# Patient Record
Sex: Female | Born: 1995 | Race: White | Hispanic: No | Marital: Single | Smoking: Never smoker
Health system: Southern US, Community
[De-identification: ages and names within clinical notes are randomized; demographics above are authoritative.]

## PROBLEM LIST (undated history)

## (undated) DIAGNOSIS — J3081 Allergic rhinitis due to animal (cat) (dog) hair and dander: Secondary | ICD-10-CM

## (undated) DIAGNOSIS — F329 Major depressive disorder, single episode, unspecified: Secondary | ICD-10-CM

## (undated) DIAGNOSIS — F913 Oppositional defiant disorder: Secondary | ICD-10-CM

## (undated) DIAGNOSIS — F32A Depression, unspecified: Secondary | ICD-10-CM

## (undated) HISTORY — DX: Major depressive disorder, single episode, unspecified: F32.9

## (undated) HISTORY — DX: Allergic rhinitis due to animal (cat) (dog) hair and dander: J30.81

## (undated) HISTORY — PX: CHOLECYSTECTOMY: SHX55

## (undated) HISTORY — DX: Oppositional defiant disorder: F91.3

## (undated) HISTORY — DX: Depression, unspecified: F32.A

## (undated) HISTORY — PX: OTHER SURGICAL HISTORY: SHX169

---
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
## 2017-01-26 DIAGNOSIS — N882 Stricture and stenosis of cervix uteri: Secondary | ICD-10-CM | POA: Insufficient documentation

## 2019-03-04 ENCOUNTER — Telehealth: Payer: Self-pay | Admitting: Obstetrics & Gynecology

## 2019-03-04 NOTE — Telephone Encounter (Signed)
Called patient regarding appointment and the following message was left: ° ° °We have you scheduled for an upcoming appointment at our office. At this time, patients are encouraged to come alone to their visits whenever possible, however, a support person, over age 21, may accompany you to your appointment if assistance is needed for safety or care concerns. Otherwise, support persons should remain outside until the visit is complete.  ° °We ask if you have had any exposure to anyone suspected or confirmed of having COVID-19 or if you are experiencing any of the following, to call and reschedule your appointment: fever, cough, shortness of breath, muscle pain, diarrhea, rash, vomiting, abdominal pain, red eye, weakness, bruising, bleeding, joint pain, or a severe headache.  ° °Please know we will ask you these questions or similar questions when you arrive for your appointment and again it’s how we are keeping everyone safe.   ° °Also,to keep you safe, please use the provided hand sanitizer when you enter the office. We are asking everyone in the office to wear a mask to help prevent the spread of °germs. If you have a mask of your own, please wear it to your appointment, if not, we are happy to provide one for you. ° °Thank you for understanding and your cooperation.  ° ° °[facility] Staff ° ° ° ° ° °

## 2019-03-05 ENCOUNTER — Other Ambulatory Visit: Payer: Self-pay

## 2019-03-05 ENCOUNTER — Encounter: Payer: Self-pay | Admitting: *Deleted

## 2019-03-05 ENCOUNTER — Ambulatory Visit (INDEPENDENT_AMBULATORY_CARE_PROVIDER_SITE_OTHER): Payer: Medicaid Other | Admitting: *Deleted

## 2019-03-05 VITALS — BP 129/87 | HR 110 | Ht 61.0 in | Wt 122.5 lb

## 2019-03-05 DIAGNOSIS — Z3201 Encounter for pregnancy test, result positive: Secondary | ICD-10-CM

## 2019-03-05 LAB — POCT URINE PREGNANCY: Preg Test, Ur: POSITIVE — AB

## 2019-03-05 NOTE — Progress Notes (Signed)
   NURSE VISIT- PREGNANCY CONFIRMATION   SUBJECTIVE:  Gabriella Wilson is a 23 y.o. G1P0 female at [redacted]w[redacted]d by certain LMP of Patient's last menstrual period was 01/26/2019 (approximate). Here for pregnancy confirmation.  Home pregnancy test: 20+ pregnancy tests at home.  She reports nausea.  She is not taking prenatal vitamins.    OBJECTIVE:  BP 129/87 (BP Location: Left Arm, Patient Position: Sitting, Cuff Size: Normal)   Pulse (!) 110   Ht 5\' 1"  (1.549 m)   Wt 122 lb 8 oz (55.6 kg)   LMP 01/26/2019 (Approximate)   BMI 23.15 kg/m   Appears well, in no apparent distress OB History  Gravida Para Term Preterm AB Living  1            SAB TAB Ectopic Multiple Live Births               # Outcome Date GA Lbr Len/2nd Weight Sex Delivery Anes PTL Lv  1 Current             Results for orders placed or performed in visit on 03/05/19 (from the past 24 hour(s))  POCT urine pregnancy   Collection Time: 03/05/19  3:11 PM  Result Value Ref Range   Preg Test, Ur Positive (A) Negative    ASSESSMENT: Positive pregnancy test, [redacted]w[redacted]d by LMP    PLAN: Schedule for dating ultrasound in 2 weeks Prenatal vitamins: note routed to [place] booker to send prescription   Nausea medicines: requested-note routed to [place] booker to send prescription   OB packet given: Yes  Levy Pupa  03/05/2019 3:12 PM

## 2019-03-15 ENCOUNTER — Telehealth: Payer: Self-pay | Admitting: Obstetrics & Gynecology

## 2019-03-15 ENCOUNTER — Other Ambulatory Visit: Payer: Self-pay | Admitting: Obstetrics & Gynecology

## 2019-03-15 DIAGNOSIS — O3680X0 Pregnancy with inconclusive fetal viability, not applicable or unspecified: Secondary | ICD-10-CM

## 2019-03-15 NOTE — Telephone Encounter (Signed)
Tried to call the patient to remind her of her appointment/restrictions, no answer.

## 2019-03-18 ENCOUNTER — Ambulatory Visit (INDEPENDENT_AMBULATORY_CARE_PROVIDER_SITE_OTHER): Payer: Medicaid Other

## 2019-03-18 ENCOUNTER — Other Ambulatory Visit: Payer: Self-pay

## 2019-03-18 DIAGNOSIS — Z3A01 Less than 8 weeks gestation of pregnancy: Secondary | ICD-10-CM

## 2019-03-18 DIAGNOSIS — O3680X0 Pregnancy with inconclusive fetal viability, not applicable or unspecified: Secondary | ICD-10-CM | POA: Diagnosis not present

## 2019-03-18 NOTE — Progress Notes (Signed)
[place] 6+1 wks,single IUP w/ys,positive fht 111 bpm,crl 5.33 mm,normal ovaries bilat

## 2019-03-19 ENCOUNTER — Other Ambulatory Visit: Payer: Medicaid Other

## 2019-04-09 ENCOUNTER — Other Ambulatory Visit (INDEPENDENT_AMBULATORY_CARE_PROVIDER_SITE_OTHER): Payer: Medicaid Other | Admitting: *Deleted

## 2019-04-09 ENCOUNTER — Other Ambulatory Visit: Payer: Self-pay

## 2019-04-09 ENCOUNTER — Other Ambulatory Visit: Payer: Self-pay | Admitting: Women's Health

## 2019-04-09 VITALS — BP 110/76 | HR 72

## 2019-04-09 DIAGNOSIS — R300 Dysuria: Secondary | ICD-10-CM

## 2019-04-09 LAB — POCT URINALYSIS DIPSTICK OB
Glucose, UA: NEGATIVE
Leukocytes, UA: NEGATIVE
Nitrite, UA: NEGATIVE

## 2019-04-09 MED ORDER — CEPHALEXIN 500 MG PO CAPS: 500.0000 mg | ORAL_CAPSULE | Freq: Four times a day (QID) | ORAL | 0 refills | Status: DC

## 2019-04-09 NOTE — Progress Notes (Addendum)
   NURSE VISIT- UTI SYMPTOMS   SUBJECTIVE:  Gabriella Wilson is a 23 y.o. G1P0 female here for UTI symptoms. She is [redacted]w[redacted]d pregnant. She reports dysuria and frequency.  OBJECTIVE:  BP 110/76 (BP Location: Right Arm, Patient Position: Sitting, Cuff Size: Normal)   Pulse 72   LMP 01/26/2019 (Approximate)   Appears well, in no apparent distress  Results for orders placed or performed in visit on 04/09/19 (from the past 24 hour(s))  POC Urinalysis Dipstick OB   Collection Time: 04/09/19 10:02 AM  Result Value Ref Range   Color, UA     Clarity, UA     Glucose, UA Negative Negative   Bilirubin, UA     Ketones, UA large    Spec Grav, UA     Blood, UA large    pH, UA     POC,PROTEIN,UA Moderate (2+) Negative, Trace, Small (1+), Moderate (2+), Large (3+), 4+   Urobilinogen, UA     Nitrite, UA neg    Leukocytes, UA Negative Negative   Appearance     Odor      ASSESSMENT: Pregnancy [redacted]w[redacted]d with UTI symptoms and negative nitrites  PLAN: Note routed to Knute Neu, CNM, [facility]   Rx sent by provider today: Yes Urine culture sent Call or return to clinic prn if these symptoms worsen or fail to improve as anticipated. Follow-up: as scheduled   Rash, Celene Squibb  04/09/2019 10:03 AM   Chart reviewed for nurse visit. Agree with plan of care. Rx keflex Roma Schanz, [place] 04/09/2019 1:00 PM

## 2019-04-10 ENCOUNTER — Telehealth: Payer: Self-pay | Admitting: *Deleted

## 2019-04-10 NOTE — Telephone Encounter (Addendum)
Pt is pregnant and is having some anxiety and depression. Has been on Abilify in the past, and that works great. Has tried Zoloft in the past but that med mad her angry. Pt don't feel suicidal at this time, just feels blah. Pt is due 11/10/19. Spoke with Dr. Rip Harbour. He can order Vistaril and pt wants to be sent to a counselor. I called in Vistaril 25 mg 1 tab po every 6 hours prn # 30 with 1 refill to [facility] in [place]. [place]

## 2019-04-10 NOTE — Telephone Encounter (Signed)
The # pt left is not in service; I called the [phone] # and left a message for pt to return call. Keddie

## 2019-04-10 NOTE — Telephone Encounter (Signed)
Pt left message that she needs to speak with someone about anxiety and depression. She is really struggling currently.

## 2019-04-11 NOTE — Telephone Encounter (Signed)
Will email information today to [facility].

## 2019-04-13 LAB — URINE CULTURE

## 2019-04-13 LAB — SPECIMEN STATUS REPORT

## 2019-04-15 ENCOUNTER — Encounter: Payer: Self-pay | Admitting: Women's Health

## 2019-04-15 DIAGNOSIS — O2341 Unspecified infection of urinary tract in pregnancy, first trimester: Secondary | ICD-10-CM | POA: Insufficient documentation

## 2019-04-26 ENCOUNTER — Other Ambulatory Visit: Payer: Self-pay | Admitting: Obstetrics & Gynecology

## 2019-04-26 ENCOUNTER — Telehealth: Payer: Self-pay | Admitting: *Deleted

## 2019-04-26 ENCOUNTER — Telehealth: Payer: Self-pay | Admitting: Obstetrics & Gynecology

## 2019-04-26 DIAGNOSIS — Z3682 Encounter for antenatal screening for nuchal translucency: Secondary | ICD-10-CM

## 2019-04-26 MED ORDER — CEPHALEXIN 500 MG PO CAPS: 500.0000 mg | ORAL_CAPSULE | Freq: Four times a day (QID) | ORAL | 0 refills | Status: DC

## 2019-04-26 NOTE — Telephone Encounter (Signed)
LMVOM returning patient's call.  

## 2019-04-26 NOTE — Telephone Encounter (Signed)
Patient left message that she has recently moved and lost her antibiotics.  She only took a few pills and is still having the same symptoms. Requesting a refill on the antibiotics.

## 2019-04-26 NOTE — Telephone Encounter (Signed)
Patient states she only took 4 days worth of the antibiotics prescribed for an UTI as she was in the process of moving and lost the medication.  She was feeling better so she did not worry about it but now her symptoms have returned.  C/o burning with urination, urgency, frequency.  She is requesting the antibiotics be resent for treatment.

## 2019-05-01 ENCOUNTER — Telehealth: Payer: Self-pay | Admitting: Advanced Practice Midwife

## 2019-05-01 NOTE — Telephone Encounter (Signed)

## 2019-05-06 ENCOUNTER — Other Ambulatory Visit: Payer: Self-pay

## 2019-05-06 ENCOUNTER — Encounter: Payer: Self-pay | Admitting: Advanced Practice Midwife

## 2019-05-06 ENCOUNTER — Ambulatory Visit (INDEPENDENT_AMBULATORY_CARE_PROVIDER_SITE_OTHER): Payer: Medicaid Other | Admitting: Advanced Practice Midwife

## 2019-05-06 ENCOUNTER — Ambulatory Visit (INDEPENDENT_AMBULATORY_CARE_PROVIDER_SITE_OTHER): Payer: Medicaid Other

## 2019-05-06 ENCOUNTER — Ambulatory Visit: Payer: Medicaid Other | Admitting: *Deleted

## 2019-05-06 VITALS — BP 115/75 | HR 91 | Wt 116.0 lb

## 2019-05-06 DIAGNOSIS — Z3A13 13 weeks gestation of pregnancy: Secondary | ICD-10-CM

## 2019-05-06 DIAGNOSIS — Z1389 Encounter for screening for other disorder: Secondary | ICD-10-CM

## 2019-05-06 DIAGNOSIS — Z3401 Encounter for supervision of normal first pregnancy, first trimester: Secondary | ICD-10-CM

## 2019-05-06 DIAGNOSIS — Z3682 Encounter for antenatal screening for nuchal translucency: Secondary | ICD-10-CM

## 2019-05-06 DIAGNOSIS — O0990 Supervision of high risk pregnancy, unspecified, unspecified trimester: Secondary | ICD-10-CM | POA: Insufficient documentation

## 2019-05-06 DIAGNOSIS — Z113 Encounter for screening for infections with a predominantly sexual mode of transmission: Secondary | ICD-10-CM

## 2019-05-06 DIAGNOSIS — Z3402 Encounter for supervision of normal first pregnancy, second trimester: Secondary | ICD-10-CM

## 2019-05-06 DIAGNOSIS — Z1379 Encounter for other screening for genetic and chromosomal anomalies: Secondary | ICD-10-CM

## 2019-05-06 DIAGNOSIS — Z130 Encounter for screening for diseases of the blood and blood-forming organs and certain disorders involving the immune mechanism: Secondary | ICD-10-CM

## 2019-05-06 DIAGNOSIS — Z331 Pregnant state, incidental: Secondary | ICD-10-CM

## 2019-05-06 DIAGNOSIS — Z0283 Encounter for blood-alcohol and blood-drug test: Secondary | ICD-10-CM

## 2019-05-06 DIAGNOSIS — Z3400 Encounter for supervision of normal first pregnancy, unspecified trimester: Secondary | ICD-10-CM | POA: Insufficient documentation

## 2019-05-06 LAB — POCT URINALYSIS DIPSTICK OB
Blood, UA: NEGATIVE
Glucose, UA: NEGATIVE
Leukocytes, UA: NEGATIVE
Nitrite, UA: NEGATIVE
POC,PROTEIN,UA: NEGATIVE

## 2019-05-06 NOTE — Patient Instructions (Signed)

## 2019-05-06 NOTE — Progress Notes (Signed)
     INITIAL OBSTETRICAL VISIT Patient name: Gabriella Wilson MRN [id number]  Date of birth: 01-13-1996 Chief Complaint:   Initial Prenatal Visit (nt/it)  History of Present Illness:   Gabriella Wilson is a 23 y.o. G68P0 Caucasian female at [redacted]w[redacted]d by LMP/early [place] with an Estimated Date of Delivery: 11/10/19 being seen today for her initial obstetrical visit.   Her obstetrical history is significant for first baby.   Today she reports no complaints.  Patient's last menstrual period was 01/26/2019 (approximate). Last pap n/a Review of Systems:   Pertinent items are noted in HPI Denies cramping/contractions, leakage of fluid, vaginal bleeding, abnormal vaginal discharge w/ itching/odor/irritation, headaches, visual changes, shortness of breath, chest pain, abdominal pain, severe nausea/vomiting, or problems with urination or bowel movements unless otherwise stated above.  Pertinent History Reviewed:  Reviewed past medical,surgical, social, obstetrical and family history.  Reviewed problem list, medications and allergies. OB History  Gravida Para Term Preterm AB Living  1            SAB TAB Ectopic Multiple Live Births               # Outcome Date GA Lbr Len/2nd Weight Sex Delivery Anes PTL Lv  1 Current            Physical Assessment:   Vitals:   05/06/19 1446  BP: 115/75  Pulse: 91  Weight: 116 lb (52.6 kg)  Body mass index is 21.92 kg/m.       Physical Examination:  General appearance - well appearing, and in no distress  Mental status - alert, oriented to person, place, and time  Psych:  She has a normal mood and affect  Skin - warm and dry, normal color, no suspicious lesions noted  Chest - effort normal, all lung fields clear to auscultation bilaterally  Heart - normal rate and regular rhythm  Abdomen - soft, nontender  Extremities:  No swelling or varicosities noted     via [place] 13+1 wks,measurLowements c/w dates,crl 76.53 mm,NT 1.5 mm,NB present,fhr 141 bpm,fundal placenta,normal  ovaries   No results found for this or any previous visit (from the past 24 hour(s)).  Assessment & Plan:  1) Low-Risk Pregnancy G1P0 at [redacted]w[redacted]d with an Estimated Date of Delivery: 11/10/19   2) Initial OB visit   Meds: No orders of the defined types were placed in this encounter.   Initial labs obtained Continue prenatal vitamins Reviewed n/v relief measures and warning s/s to report Reviewed recommended weight gain based on pre-gravid BMI Encouraged well-balanced diet Watched video for carrier screening/genetic testing:  Genetic Screening discussed First Screen and Integrated Screen: requested Cystic fibrosis screening requested SMA screening requested Fragile X screening requested Ultrasound discussed; fetal survey: requested CCNC completed  Follow-up: Return in about 5 weeks (around 06/10/2019) for VE:HMCNOBS, LROB.   Orders Placed This Encounter  Procedures  . Urine Culture  . GC/Chlamydia Probe Amp  . Urinalysis, Routine w reflex microscopic  . Pain Management Screening Profile (10S)  . Sickle Cell Scr  . Integrated 1  . MaterniT21 PLUS Core  . Inheritest Core(CF97,SMA,FraX)  . Obstetric Panel, Including HIV  . Specimen status report  . Specimen status report  . Pain Management Screening Profile (10S)  . Urinalysis, Routine w reflex microscopic  . Specimen status report  . Specimen status report  . Specimen status report  . POC Urinalysis Dipstick OB    Christin Fudge DNP, CNM 05/09/2019 9:02 AM

## 2019-05-06 NOTE — Progress Notes (Signed)
[place] 13+1 wks,measurements c/w dates,crl 76.53 mm,NT 1.5 mm,NB present,fhr 141 bpm,fundal placenta,normal ovaries

## 2019-05-07 LAB — SPECIMEN STATUS REPORT

## 2019-05-07 LAB — SICKLE CELL SCREEN: Sickle Cell Screen: NEGATIVE

## 2019-05-07 LAB — PMP SCREEN PROFILE (10S), URINE

## 2019-05-07 LAB — URINALYSIS, ROUTINE W REFLEX MICROSCOPIC

## 2019-05-09 LAB — PMP SCREEN PROFILE (10S), URINE
Amphetamine Scrn, Ur: NEGATIVE ng/mL
BARBITURATE SCREEN URINE: NEGATIVE ng/mL
BENZODIAZEPINE SCREEN, URINE: NEGATIVE ng/mL
CANNABINOIDS UR QL SCN: NEGATIVE ng/mL
Cocaine (Metab) Scrn, Ur: NEGATIVE ng/mL
Creatinine(Crt), U: 242.4 mg/dL (ref 20.0–300.0)
Methadone Screen, Urine: NEGATIVE ng/mL
OXYCODONE+OXYMORPHONE UR QL SCN: NEGATIVE ng/mL
Opiate Scrn, Ur: NEGATIVE ng/mL
Ph of Urine: 5.7 (ref 4.5–8.9)
Phencyclidine Qn, Ur: NEGATIVE ng/mL
Propoxyphene Scrn, Ur: NEGATIVE ng/mL

## 2019-05-09 LAB — URINE CULTURE

## 2019-05-09 LAB — URINALYSIS, ROUTINE W REFLEX MICROSCOPIC
Bilirubin, UA: NEGATIVE
Glucose, UA: NEGATIVE
Leukocytes,UA: NEGATIVE
Nitrite, UA: NEGATIVE
Protein,UA: NEGATIVE
RBC, UA: NEGATIVE
Specific Gravity, UA: >=1.03 — AB (ref 1.005–1.030)
Urobilinogen, Ur: 1 mg/dL (ref 0.2–1.0)
pH, UA: 5 (ref 5.0–7.5)

## 2019-05-09 LAB — SPECIMEN STATUS REPORT

## 2019-05-10 ENCOUNTER — Other Ambulatory Visit: Payer: Self-pay | Admitting: Advanced Practice Midwife

## 2019-05-10 LAB — GC/CHLAMYDIA PROBE AMP

## 2019-05-10 LAB — SPECIMEN STATUS REPORT

## 2019-05-10 MED ORDER — NITROFURANTOIN MONOHYD MACRO 100 MG PO CAPS: 100.0000 mg | ORAL_CAPSULE | Freq: Two times a day (BID) | ORAL | 0 refills | Status: DC

## 2019-05-10 NOTE — Progress Notes (Signed)
macrobid for + E coli 

## 2019-05-10 NOTE — Progress Notes (Signed)
macrobid for [employer]

## 2019-05-13 ENCOUNTER — Telehealth: Payer: Self-pay | Admitting: *Deleted

## 2019-05-13 NOTE — Telephone Encounter (Signed)
Pt left message that she needs to speak with someone about her results.

## 2019-05-13 NOTE — Telephone Encounter (Signed)
Called patient back. She wanted to know about genetic screening. Informed of normal maternit21.

## 2019-05-15 ENCOUNTER — Other Ambulatory Visit: Payer: Self-pay

## 2019-05-15 DIAGNOSIS — Z20822 Contact with and (suspected) exposure to covid-19: Secondary | ICD-10-CM

## 2019-05-16 ENCOUNTER — Encounter: Payer: Self-pay | Admitting: Advanced Practice Midwife

## 2019-05-16 LAB — INTEGRATED 1
Crown Rump Length: 76.5 mm
Gest. Age on Collection Date: 13.4 weeks
Maternal Age at EDD: 21.5 yr
Nuchal Translucency (NT): 1.5 mm
Number of Fetuses: 1
PAPP-A Value: 2948.3 ng/mL
Weight: 116 [lb_av]

## 2019-05-16 LAB — MATERNIT 21 PLUS CORE, BLOOD
Fetal Fraction: 10
Result (T21): NEGATIVE
Trisomy 13 (Patau syndrome): NEGATIVE
Trisomy 18 (Edwards syndrome): NEGATIVE
Trisomy 21 (Down syndrome): NEGATIVE

## 2019-05-16 LAB — OBSTETRIC PANEL, INCLUDING HIV
Antibody Screen: NEGATIVE
Basophils Absolute: 0 10*3/uL (ref 0.0–0.2)
Basos: 0 %
EOS (ABSOLUTE): 0.2 10*3/uL (ref 0.0–0.4)
Eos: 2 %
HIV Screen 4th Generation wRfx: NONREACTIVE
Hematocrit: 31.8 % — ABNORMAL LOW (ref 34.0–46.6)
Hemoglobin: 10.7 g/dL — ABNORMAL LOW (ref 11.1–15.9)
Hepatitis B Surface Ag: NEGATIVE
Immature Grans (Abs): 0 10*3/uL (ref 0.0–0.1)
Immature Granulocytes: 0 %
Lymphocytes Absolute: 3 10*3/uL (ref 0.7–3.1)
Lymphs: 35 %
MCH: 29.4 pg (ref 26.6–33.0)
MCHC: 33.6 g/dL (ref 31.5–35.7)
MCV: 87 fL (ref 79–97)
Monocytes Absolute: 0.5 10*3/uL (ref 0.1–0.9)
Monocytes: 6 %
Neutrophils Absolute: 4.8 10*3/uL (ref 1.4–7.0)
Neutrophils: 57 %
Platelets: 266 10*3/uL (ref 150–450)
RBC: 3.64 x10E6/uL — ABNORMAL LOW (ref 3.77–5.28)
RDW: 12.4 % (ref 11.7–15.4)
RPR Ser Ql: NONREACTIVE
Rh Factor: POSITIVE
Rubella Antibodies, IGG: 7.65 index (ref >0.99)
WBC: 8.5 10*3/uL (ref 3.4–10.8)

## 2019-05-16 LAB — INHERITEST CORE(CF97,SMA,FRAX)

## 2019-05-16 LAB — NOVEL CORONAVIRUS, NAA: SARS-CoV-2, NAA: NOT DETECTED

## 2019-06-05 ENCOUNTER — Other Ambulatory Visit: Payer: Self-pay | Admitting: Advanced Practice Midwife

## 2019-06-05 DIAGNOSIS — Z363 Encounter for antenatal screening for malformations: Secondary | ICD-10-CM

## 2019-06-07 NOTE — L&D Delivery Note (Signed)
[name] Faculty Practice Delivery Note  Gabriella Wilson is a 24 y.o. G1P0 s/p VD at [redacted]w[redacted]d. She was admitted for IOL for vaginal bleeding.   ROM: 8h 77m with clear fluid GBS Status: Negative/-- (05/12 1637) Maximum Maternal Temperature: 98.30F  Labor Progress: . Initial SVE: 1/80/-2. Patient received Cytotec, Foley balloon, Pitocin and AROM. Received epidural. She then progressed to complete.   Delivery Date/Time: 5/19 @ 1937 Delivery: Called to room and patient was complete and pushing. Head delivered in ROA position. No nuchal cord present. Shoulder and body delivered in usual fashion. Infant with spontaneous cry, placed on mother's abdomen, dried and stimulated. Cord clamped x 2 after 1-minute delay, and cut by FOB. Cord blood drawn. Placenta delivered spontaneously with gentle cord traction. Fundus firm with massage and Pitocin. Labia, perineum, vagina, and cervix inspected inspected with peri-urethral laceration which was repaired with 4-0 Vicryl in a standard fashion.  Baby Weight: pending  Placenta: Sent to L&D Complications: None Lacerations: periurethral  EBL: 487 mL Analgesia: Epidural   Infant:  APGAR (1 MIN): 9   APGAR (5 MINS): 9   APGAR (10 MINS):     Jerilynn Birkenhead, MD [facility] Family Medicine Fellow, [facility] for [facility], [facility] 10/23/2019, 8:06 PM

## 2019-06-10 ENCOUNTER — Ambulatory Visit (INDEPENDENT_AMBULATORY_CARE_PROVIDER_SITE_OTHER): Payer: Medicaid Other | Admitting: Advanced Practice Midwife

## 2019-06-10 ENCOUNTER — Ambulatory Visit (INDEPENDENT_AMBULATORY_CARE_PROVIDER_SITE_OTHER): Payer: Medicaid Other

## 2019-06-10 ENCOUNTER — Other Ambulatory Visit: Payer: Self-pay

## 2019-06-10 ENCOUNTER — Encounter: Payer: Self-pay | Admitting: Advanced Practice Midwife

## 2019-06-10 VITALS — BP 104/65 | HR 95 | Wt 119.3 lb

## 2019-06-10 DIAGNOSIS — Z1379 Encounter for other screening for genetic and chromosomal anomalies: Secondary | ICD-10-CM

## 2019-06-10 DIAGNOSIS — Z3A18 18 weeks gestation of pregnancy: Secondary | ICD-10-CM | POA: Diagnosis not present

## 2019-06-10 DIAGNOSIS — Z3402 Encounter for supervision of normal first pregnancy, second trimester: Secondary | ICD-10-CM

## 2019-06-10 DIAGNOSIS — Z363 Encounter for antenatal screening for malformations: Secondary | ICD-10-CM | POA: Diagnosis not present

## 2019-06-10 DIAGNOSIS — O2341 Unspecified infection of urinary tract in pregnancy, first trimester: Secondary | ICD-10-CM

## 2019-06-10 DIAGNOSIS — Z331 Pregnant state, incidental: Secondary | ICD-10-CM

## 2019-06-10 DIAGNOSIS — Z1389 Encounter for screening for other disorder: Secondary | ICD-10-CM

## 2019-06-10 LAB — POCT URINALYSIS DIPSTICK OB
Blood, UA: NEGATIVE
Glucose, UA: NEGATIVE
Ketones, UA: NEGATIVE
Leukocytes, UA: NEGATIVE
Nitrite, UA: NEGATIVE
POC,PROTEIN,UA: NEGATIVE

## 2019-06-10 NOTE — Patient Instructions (Signed)
Gabriella Wilson, I greatly value your feedback.  If you receive a survey following your visit with [place] today, we appreciate you taking the time to fill it out.  Thanks, Cathie Beams, CNM     [facility] HAS MOVED!!! It is now [facility] ([address] [place], [place] [address]) Entrance located off of [address] Free 24/7 valet parking   Go to [email or web address] to register for FREE online childbirth classes    Second Trimester of Pregnancy The second trimester is from week 14 through week 27 (months 4 through 6). The second trimester is often a time when you feel your best. Your body has adjusted to being pregnant, and you begin to feel better physically. Usually, morning sickness has lessened or quit completely, you may have more energy, and you may have an increase in appetite. The second trimester is also a time when the fetus is growing rapidly. At the end of the sixth month, the fetus is about 9 inches long and weighs about 1 pounds. You will likely begin to feel the baby move (quickening) between 16 and 20 weeks of pregnancy. Body changes during your second trimester Your body continues to go through many changes during your second trimester. The changes vary from woman to woman.  Your weight will continue to increase. You will notice your lower abdomen bulging out.  You may begin to get stretch marks on your hips, abdomen, and breasts.  You may develop headaches that can be relieved by medicines. The medicines should be approved by your health care provider.  You may urinate more often because the fetus is pressing on your bladder.  You may develop or continue to have heartburn as a result of your pregnancy.  You may develop constipation because certain hormones are causing the muscles that push waste through your intestines to slow down.  You may develop hemorrhoids or swollen, bulging veins (varicose veins).  You may have back  pain. This is caused by: ? Weight gain. ? Pregnancy hormones that are relaxing the joints in your pelvis. ? A shift in weight and the muscles that support your balance.  Your breasts will continue to grow and they will continue to become tender.  Your gums may bleed and may be sensitive to brushing and flossing.  Dark spots or blotches (chloasma, mask of pregnancy) may develop on your face. This will likely fade after the baby is born.  A dark line from your belly button to the pubic area (linea nigra) may appear. This will likely fade after the baby is born.  You may have changes in your hair. These can include thickening of your hair, rapid growth, and changes in texture. Some women also have hair loss during or after pregnancy, or hair that feels dry or thin. Your hair will most likely return to normal after your baby is born.  What to expect at prenatal visits During a routine prenatal visit:  You will be weighed to make sure you and the fetus are growing normally.  Your blood pressure will be taken.  Your abdomen will be measured to track your baby's growth.  The fetal heartbeat will be listened to.  Any test results from the previous visit will be discussed.  Your health care provider may ask you:  How you are feeling.  If you are feeling the baby move.  If you have had any abnormal symptoms, such as leaking fluid, bleeding, severe headaches, or  abdominal cramping.  If you are using any tobacco products, including cigarettes, chewing tobacco, and electronic cigarettes.  If you have any questions.  Other tests that may be performed during your second trimester include:  Blood tests that check for: ? Low iron levels (anemia). ? High blood sugar that affects pregnant women (gestational diabetes) between 10 and 28 weeks. ? Rh antibodies. This is to check for a protein on red blood cells (Rh factor).  Urine tests to check for infections, diabetes, or protein in the  urine.  An ultrasound to confirm the proper growth and development of the baby.  An amniocentesis to check for possible genetic problems.  Fetal screens for spina bifida and Down syndrome.  HIV (human immunodeficiency virus) testing. Routine prenatal testing includes screening for HIV, unless you choose not to have this test.  Follow these instructions at home: Medicines  Follow your health care provider's instructions regarding medicine use. Specific medicines may be either safe or unsafe to take during pregnancy.  Take a prenatal vitamin that contains at least 600 micrograms (mcg) of folic acid.  If you develop constipation, try taking a stool softener if your health care provider approves. Eating and drinking  Eat a balanced diet that includes fresh fruits and vegetables, whole grains, good sources of protein such as meat, eggs, or tofu, and low-fat dairy. Your health care provider will help you determine the amount of weight gain that is right for you.  Avoid raw meat and uncooked cheese. These carry germs that can cause birth defects in the baby.  If you have low calcium intake from food, talk to your health care provider about whether you should take a daily calcium supplement.  Limit foods that are high in fat and processed sugars, such as fried and sweet foods.  To prevent constipation: ? Drink enough fluid to keep your urine clear or pale yellow. ? Eat foods that are high in fiber, such as fresh fruits and vegetables, whole grains, and beans. Activity  Exercise only as directed by your health care provider. Most women can continue their usual exercise routine during pregnancy. Try to exercise for 30 minutes at least 5 days a week. Stop exercising if you experience uterine contractions.  Avoid heavy lifting, wear low heel shoes, and practice good posture.  A sexual relationship may be continued unless your health care provider directs you otherwise. Relieving pain and  discomfort  Wear a good support bra to prevent discomfort from breast tenderness.  Take warm sitz baths to soothe any pain or discomfort caused by hemorrhoids. Use hemorrhoid cream if your health care provider approves.  Rest with your legs elevated if you have leg cramps or low back pain.  If you develop varicose veins, wear support hose. Elevate your feet for 15 minutes, 3-4 times a day. Limit salt in your diet. Prenatal Care  Write down your questions. Take them to your prenatal visits.  Keep all your prenatal visits as told by your health care provider. This is important. Safety  Wear your seat belt at all times when driving.  Make a list of emergency phone numbers, including numbers for family, friends, the hospital, and police and fire departments. General instructions  Ask your health care provider for a referral to a local prenatal education class. Begin classes no later than the beginning of month 6 of your pregnancy.  Ask for help if you have counseling or nutritional needs during pregnancy. Your health care provider can offer advice or  refer you to specialists for help with various needs.  Do not use hot tubs, steam rooms, or saunas.  Do not douche or use tampons or scented sanitary pads.  Do not cross your legs for long periods of time.  Avoid cat litter boxes and soil used by cats. These carry germs that can cause birth defects in the baby and possibly loss of the fetus by miscarriage or stillbirth.  Avoid all smoking, herbs, alcohol, and unprescribed drugs. Chemicals in these products can affect the formation and growth of the baby.  Do not use any products that contain nicotine or tobacco, such as cigarettes and e-cigarettes. If you need help quitting, ask your health care provider.  Visit your dentist if you have not gone yet during your pregnancy. Use a soft toothbrush to brush your teeth and be gentle when you floss. Contact a health care provider if:  You  have dizziness.  You have mild pelvic cramps, pelvic pressure, or nagging pain in the abdominal area.  You have persistent nausea, vomiting, or diarrhea.  You have a bad smelling vaginal discharge.  You have pain when you urinate. Get help right away if:  You have a fever.  You are leaking fluid from your vagina.  You have spotting or bleeding from your vagina.  You have severe abdominal cramping or pain.  You have rapid weight gain or weight loss.  You have shortness of breath with chest pain.  You notice sudden or extreme swelling of your face, hands, ankles, feet, or legs.  You have not felt your baby move in over an hour.  You have severe headaches that do not go away when you take medicine.  You have vision changes. Summary  The second trimester is from week 14 through week 27 (months 4 through 6). It is also a time when the fetus is growing rapidly.  Your body goes through many changes during pregnancy. The changes vary from woman to woman.  Avoid all smoking, herbs, alcohol, and unprescribed drugs. These chemicals affect the formation and growth your baby.  Do not use any tobacco products, such as cigarettes, chewing tobacco, and e-cigarettes. If you need help quitting, ask your health care provider.  Contact your health care provider if you have any questions. Keep all prenatal visits as told by your health care provider. This is important. This information is not intended to replace advice given to you by your health care provider. Make sure you discuss any questions you have with your health care provider.

## 2019-06-10 NOTE — Progress Notes (Signed)
   LOW-RISK PREGNANCY VISIT Patient name: Gabriella Wilson MRN [id number]  Date of birth: 07/05/1995 Chief Complaint:   Routine Prenatal Visit (2 nd IT)  History of Present Illness:   Gabriella Wilson is a 24 y.o. G1P0 female at [redacted]w[redacted]d with an Estimated Date of Delivery: 11/10/19 being seen today for ongoing management of a low-risk pregnancy.  Today she reports some ankle swelling.  . Contractions: Not present.  .  Movement: Absent. denies leaking of fluid. Review of Systems:   Pertinent items are noted in HPI Denies abnormal vaginal discharge w/ itching/odor/irritation, headaches, visual changes, shortness of breath, chest pain, abdominal pain, severe nausea/vomiting, or problems with urination or bowel movements unless otherwise stated above. Pertinent History Reviewed:  Reviewed past medical,surgical, social, obstetrical and family history.  Reviewed problem list, medications and allergies. Physical Assessment:   Vitals:   06/10/19 1558  BP: 104/65  Pulse: 95  Weight: 119 lb 4.8 oz (54.1 kg)  Body mass index is 22.54 kg/m.        Physical Examination:   General appearance: Well appearing, and in no distress  Mental status: Alert, oriented to person, place, and time  Skin: Warm & dry  Cardiovascular: Normal heart rate noted  Respiratory: Normal respiratory effort, no distress  Abdomen: Soft, gravid, nontender  Pelvic: Cervical exam deferred         Extremities: Edema: None  Fetal Status:     Movement: Absent   [place] 18+1 wks,cephalic,posterior placenta gr 0,normal ovaries,cx 2.8 cm,svp of fluid 7.1 cm,fhr 148 bpm,efw 265 g 87%,anatomy complete,no obvious abnormalities   Chaperone: n/a    Results for orders placed or performed in visit on 06/10/19 (from the past 24 hour(s))  POC Urinalysis Dipstick OB   Collection Time: 06/10/19  4:04 PM  Result Value Ref Range   Color, UA     Clarity, UA     Glucose, UA Negative Negative   Bilirubin, UA     Ketones, UA neg    Spec Grav, UA     Blood, UA neg    pH, UA     POC,PROTEIN,UA Negative Negative, Trace, Small (1+), Moderate (2+), Large (3+), 4+   Urobilinogen, UA     Nitrite, UA neg    Leukocytes, UA Negative Negative   Appearance     Odor      Assessment & Plan:  1) Low-risk pregnancy G1P0 at [redacted]w[redacted]d with an Estimated Date of Delivery: 11/10/19     Meds: No orders of the defined types were placed in this encounter.  Labs/procedures today: anatomy scan  Plan:  Continue routine obstetrical care  Next visit: prefers online    Reviewed: Preterm labor symptoms and general obstetric precautions including but not limited to vaginal bleeding, contractions, leaking of fluid and fetal movement were reviewed in detail with the patient.  All questions were answered. Hasn't gotten her home bp cuff. Will recheck  Check bp weekly, let us know if >140/90.   Follow-up: Return in about 4 weeks (around 07/08/2019) for [facility] Mychart visit.  Orders Placed This Encounter  Procedures  . INTEGRATED 2  . POC Urinalysis Dipstick OB   Jacklyn Shell DNP, CNM 06/10/2019 4:22 PM

## 2019-06-10 NOTE — Progress Notes (Signed)
[place] 18+1 wks,cephalic,posterior placenta gr 0,normal ovaries,cx 2.8 cm,svp of fluid 7.1 cm,fhr 148 bpm,efw 265 g 87%,anatomy complete,no obvious abnormalities

## 2019-06-12 LAB — INTEGRATED 2
AFP MoM: 1.18
Alpha-Fetoprotein: 59.4 ng/mL
Crown Rump Length: 76.5 mm
DIA MoM: 0.43
DIA Value: 84.2 pg/mL
Estriol, Unconjugated: 1.85 ng/mL
Gest. Age on Collection Date: 13.4 weeks
Gestational Age: 18.4 weeks
Maternal Age at EDD: 21.5 yr
Nuchal Translucency (NT): 1.5 mm
Nuchal Translucency MoM: 0.84
Number of Fetuses: 1
PAPP-A MoM: 1.7
PAPP-A Value: 2948.3 ng/mL
Test Results:: NEGATIVE
Weight: 116 [lb_av]
Weight: 116 [lb_av]
hCG MoM: 0.32
hCG Value: 8.9 IU/mL
uE3 MoM: 1.1

## 2019-06-28 ENCOUNTER — Telehealth: Payer: Self-pay | Admitting: Obstetrics & Gynecology

## 2019-06-28 ENCOUNTER — Encounter: Payer: Self-pay | Admitting: Advanced Practice Midwife

## 2019-06-28 ENCOUNTER — Telehealth: Payer: Self-pay | Admitting: *Deleted

## 2019-06-28 MED ORDER — OMEPRAZOLE 20 MG PO CPDR: 20.0000 mg | DELAYED_RELEASE_CAPSULE | Freq: Every day | ORAL | 6 refills | Status: DC

## 2019-06-28 NOTE — Telephone Encounter (Signed)
Omperazole was e prescribed

## 2019-06-28 NOTE — Telephone Encounter (Signed)
Patient is requesting a prescription for treatment of indigestion as she has been eating TUMS like candy.

## 2019-07-09 ENCOUNTER — Encounter: Payer: Self-pay | Admitting: Advanced Practice Midwife

## 2019-07-09 ENCOUNTER — Other Ambulatory Visit: Payer: Self-pay

## 2019-07-09 ENCOUNTER — Telehealth (INDEPENDENT_AMBULATORY_CARE_PROVIDER_SITE_OTHER): Payer: Medicaid Other | Admitting: Advanced Practice Midwife

## 2019-07-09 DIAGNOSIS — Z3402 Encounter for supervision of normal first pregnancy, second trimester: Secondary | ICD-10-CM

## 2019-07-09 DIAGNOSIS — Z3A22 22 weeks gestation of pregnancy: Secondary | ICD-10-CM

## 2019-07-09 DIAGNOSIS — O2342 Unspecified infection of urinary tract in pregnancy, second trimester: Secondary | ICD-10-CM | POA: Diagnosis not present

## 2019-07-09 DIAGNOSIS — O2341 Unspecified infection of urinary tract in pregnancy, first trimester: Secondary | ICD-10-CM

## 2019-07-09 NOTE — Progress Notes (Signed)
   TELEHEALTH VIRTUAL OBSTETRICS VISIT ENCOUNTER NOTE Patient name: Gabriella Wilson MRN [id number]  Date of birth: August 30, 1995  I connected with patient on 07/09/19 at 11:10 AM EST by telephone (technical difficulties) and verified that I am speaking with the correct person using two identifiers. Due to COVID-19 recommendations, pt is not currently in our office.    I discussed the limitations, risks, security and privacy concerns of performing an evaluation and management service by telephone and the availability of in person appointments. I also discussed with the patient that there may be a patient responsible charge related to this service. The patient expressed understanding and agreed to proceed.  Chief Complaint:   Routine Prenatal Visit  History of Present Illness:   Gabriella Wilson is a 24 y.o. G1P0 female at [redacted]w[redacted]d with an Estimated Date of Delivery: 11/10/19 being evaluated today for ongoing management of a low-risk pregnancy.  Today she reports heartburn and hip discomfort. Contractions: Not present. Vag. Bleeding: None.  Movement: Present. denies leaking of fluid. Review of Systems:   Pertinent items are noted in HPI Denies abnormal vaginal discharge w/ itching/odor/irritation, headaches, visual changes, shortness of breath, chest pain, abdominal pain, severe nausea/vomiting, or problems with urination or bowel movements unless otherwise stated above. Pertinent History Reviewed:  Reviewed past medical,surgical, social, obstetrical and family history.  Reviewed problem list, medications and allergies. Physical Assessment:   Vitals:   07/09/19 1100  BP: (!) 126/58  Pulse: 85  There is no height or weight on file to calculate BMI.        Physical Examination:   General:  Alert, oriented and cooperative.   Mental Status: Normal mood and affect perceived. Normal judgment and thought content.  Rest of physical exam deferred due to type of encounter  No results found for this or any  previous visit (from the past 24 hour(s)).  Assessment & Plan:  1) Pregnancy G1P0 at [redacted]w[redacted]d with an Estimated Date of Delivery: 11/10/19   2) UTI in November, will get urine culture at next visit  3) Considering IUD for contraception   Meds: No orders of the defined types were placed in this encounter.   Labs/procedures today: none  Plan:  Continue routine obstetrical care.  Has home bp cuff.  Check bp weekly, let us know if >140/90.  Next visit: prefers will be in person for PN2 and urine culture    Reviewed: Preterm labor symptoms and general obstetric precautions including but not limited to vaginal bleeding, contractions, leaking of fluid and fetal movement were reviewed in detail with the patient. The patient was advised to call back or seek an in-person office evaluation/go to [facility]U at [facility] for any urgent or concerning symptoms. All questions were answered. Please refer to After Visit Summary for other counseling recommendations.    I provided 12 minutes of non-face-to-face time during this encounter.  Follow-up: No follow-ups on file.  No orders of the defined types were placed in this encounter.  [name] CNM 07/09/2019 11:41 AM

## 2019-08-06 ENCOUNTER — Encounter: Payer: Self-pay | Admitting: Women's Health

## 2019-08-06 ENCOUNTER — Other Ambulatory Visit: Payer: Medicaid Other

## 2019-08-06 ENCOUNTER — Ambulatory Visit (INDEPENDENT_AMBULATORY_CARE_PROVIDER_SITE_OTHER): Payer: Medicaid Other | Admitting: Women's Health

## 2019-08-06 ENCOUNTER — Other Ambulatory Visit: Payer: Self-pay

## 2019-08-06 ENCOUNTER — Other Ambulatory Visit (HOSPITAL_COMMUNITY)
Admission: RE | Admit: 2019-08-06 | Discharge: 2019-08-06 | Disposition: A | Discharge status: Home / Self Care | Payer: Medicaid Other | Source: Ambulatory Visit | Attending: Obstetrics & Gynecology | Admitting: Obstetrics & Gynecology

## 2019-08-06 VITALS — Wt 138.6 lb

## 2019-08-06 DIAGNOSIS — O26842 Uterine size-date discrepancy, second trimester: Secondary | ICD-10-CM

## 2019-08-06 DIAGNOSIS — Z3402 Encounter for supervision of normal first pregnancy, second trimester: Secondary | ICD-10-CM

## 2019-08-06 DIAGNOSIS — Z01419 Encounter for gynecological examination (general) (routine) without abnormal findings: Secondary | ICD-10-CM | POA: Diagnosis not present

## 2019-08-06 DIAGNOSIS — Z331 Pregnant state, incidental: Secondary | ICD-10-CM

## 2019-08-06 DIAGNOSIS — O2341 Unspecified infection of urinary tract in pregnancy, first trimester: Secondary | ICD-10-CM

## 2019-08-06 DIAGNOSIS — O2342 Unspecified infection of urinary tract in pregnancy, second trimester: Secondary | ICD-10-CM

## 2019-08-06 DIAGNOSIS — Z3A26 26 weeks gestation of pregnancy: Secondary | ICD-10-CM

## 2019-08-06 DIAGNOSIS — Z1389 Encounter for screening for other disorder: Secondary | ICD-10-CM

## 2019-08-06 DIAGNOSIS — Z131 Encounter for screening for diabetes mellitus: Secondary | ICD-10-CM

## 2019-08-06 LAB — POCT URINALYSIS DIPSTICK OB
Blood, UA: NEGATIVE
Glucose, UA: NEGATIVE
Ketones, UA: NEGATIVE
Leukocytes, UA: NEGATIVE
Nitrite, UA: NEGATIVE
POC,PROTEIN,UA: NEGATIVE

## 2019-08-06 NOTE — Progress Notes (Signed)
   LOW-RISK PREGNANCY VISIT Patient name: Gabriella Wilson MRN [id number]  Date of birth: 10/29/1995 Chief Complaint:   Routine Prenatal Visit  History of Present Illness:   Gabriella Wilson is a 24 y.o. G1P0 female at [redacted]w[redacted]d with an Estimated Date of Delivery: 11/10/19 being seen today for ongoing management of a low-risk pregnancy.  Today she reports some hip/pelvic pressure. Appetite returning now. Contractions: Not present. Vag. Bleeding: None.  Movement: Present. denies leaking of fluid. Review of Systems:   Pertinent items are noted in HPI Denies abnormal vaginal discharge w/ itching/odor/irritation, headaches, visual changes, shortness of breath, chest pain, abdominal pain, severe nausea/vomiting, or problems with urination or bowel movements unless otherwise stated above. Pertinent History Reviewed:  Reviewed past medical,surgical, social, obstetrical and family history.  Reviewed problem list, medications and allergies. Physical Assessment:   Vitals:   08/06/19 0936  Weight: 138 lb 9.6 oz (62.9 kg)  Body mass index is 26.19 kg/m.        Physical Examination:   General appearance: Well appearing, and in no distress  Mental status: Alert, oriented to person, place, and time  Skin: Warm & dry  Cardiovascular: Normal heart rate noted  Respiratory: Normal respiratory effort, no distress  Abdomen: Soft, gravid, nontender  Pelvic: thin prep pap obtained, cx visually long and closed         Extremities: Edema: Trace  Fetal Status: Fetal Heart Rate (bpm): 145 Fundal Height: 20 cm Movement: Present    Chaperone: [place]    Results for orders placed or performed in visit on 08/06/19 (from the past 24 hour(s))  POC Urinalysis Dipstick OB   Collection Time: 08/06/19  9:35 AM  Result Value Ref Range   Color, UA     Clarity, UA     Glucose, UA Negative Negative   Bilirubin, UA     Ketones, UA n    Spec Grav, UA     Blood, UA n    pH, UA     POC,PROTEIN,UA Negative Negative,  Trace, Small (1+), Moderate (2+), Large (3+), 4+   Urobilinogen, UA     Nitrite, UA n    Leukocytes, UA Negative Negative   Appearance     Odor      Assessment & Plan:  1) Low-risk pregnancy G1P0 at [redacted]w[redacted]d with an Estimated Date of Delivery: 11/10/19   2) UTI first trimester, urine cx poc today  3) Pelvic/hip pressure> discussed belts/tape  4) Uterine size <dates- will get efw/afi u/s   Meds: No orders of the defined types were placed in this encounter.  Labs/procedures today: pn2, declined tdap/flu shot today, tdap next visit  Plan:  Continue routine obstetrical care  Next visit: prefers will be in person for u/s and tdap, online in 4wks    Reviewed: Preterm labor symptoms and general obstetric precautions including but not limited to vaginal bleeding, contractions, leaking of fluid and fetal movement were reviewed in detail with the patient.  All questions were answered. Has home bp cuff. Check bp weekly, let us know if >140/90.   Follow-up: Return for ASAP, US:EFW & , nurse visit tdap shot, then 4wks LROB online w/ CNM.  Orders Placed This Encounter  Procedures  . Urine Culture  . US OB Follow Up  . POC Urinalysis Dipstick OB   Cheral Marker CNM, [facility] 08/06/2019 10:45 AM

## 2019-08-06 NOTE — Patient Instructions (Signed)
Gabriella Wilson, I greatly value your feedback.  If you receive a survey following your visit with [place] today, we appreciate you taking the time to fill it out.  Thanks, Gabriella Wilson, CNM, [facility]  [facility] HAS MOVED!!! It is now [facility] at [facility] ([address] [place], [place] [address]) Entrance located off of [address] Free 24/7 valet parking    Go to [email or web address] to register for FREE online childbirth classes   Call the office [phone]) or go to [facility] if:  You begin to have strong, frequent contractions  Your water breaks.  Sometimes it is a big gush of fluid, sometimes it is just a trickle that keeps getting your panties wet or running down your legs  You have vaginal bleeding.  It is normal to have a small amount of spotting if your cervix was checked.   You don't feel your baby moving like normal.  If you don't, get you something to eat and drink and lay down and focus on feeling your baby move.  You should feel at least 10 movements in 2 hours.  If you don't, you should call the office or go to [facility].    Tdap Vaccine  It is recommended that you get the Tdap vaccine during the third trimester of EACH pregnancy to help protect your baby from getting pertussis (whooping cough)  27-36 weeks is the BEST time to do this so that you can pass the protection on to your baby. During pregnancy is better than after pregnancy, but if you are unable to get it during pregnancy it will be offered at the hospital.   You can get this vaccine with [place], at the health department, your family doctor, or some local pharmacies  Everyone who will be around your baby should also be up-to-date on their vaccines before the baby comes. Adults (who are not pregnant) only need 1 dose of Tdap during adulthood.   [place] Pediatricians/Family Doctors:  [facility] [phone]            [facility] [phone]                  [facility] [phone] (usually not accepting new patients unless you have family there already, you are always welcome to call and ask)       [facility] Department [phone]       [facility] Pediatricians/Family Doctors:   [facility]: [phone]  [facility]: [phone]  [facility]: [phone]  [facility] Doctors:   [facility]: [phone]   [facility]: [phone]  [facility] Doctors:  [facility]: [phone]   Home Blood Pressure Monitoring for Patients   Your provider has recommended that you check your blood pressure (BP) at least once a week at home. If you do not have a blood pressure cuff at home, one will be provided for you. Contact your provider if you have not received your monitor within 1 week.   Helpful Tips for Accurate Home Blood Pressure Checks  . Don't smoke, exercise, or drink caffeine 30 minutes before checking your BP . Use the restroom before checking your BP (a full bladder can raise your pressure) . Relax in a comfortable upright chair . Feet on the ground . Left arm resting comfortably on a flat surface at the level of your heart . Legs uncrossed . Back supported . Sit quietly and don't  talk . Place the cuff on your bare arm . Adjust snuggly, so that only two fingertips can fit between your skin and the top of the cuff . Check 2 readings separated by at least one minute . Keep a log of your BP readings . For a visual, please reference this diagram: http://ccnc.care/bpdiagram  Provider Name: [name]     Phone: [phone]  Zone 1: ALL CLEAR  Continue to monitor your symptoms:  . BP reading is less than 140 (top number) or less than 90 (bottom number)  . No right upper stomach pain . No headaches or seeing spots . No feeling nauseated or throwing up . No swelling in face and  hands  Zone 2: CAUTION Call your doctor's office for any of the following:  . BP reading is greater than 140 (top number) or greater than 90 (bottom number)  . Stomach pain under your ribs in the middle or right side . Headaches or seeing spots . Feeling nauseated or throwing up . Swelling in face and hands  Zone 3: EMERGENCY  Seek immediate medical care if you have any of the following:  . BP reading is greater than160 (top number) or greater than 110 (bottom number) . Severe headaches not improving with Tylenol . Serious difficulty catching your breath . Any worsening symptoms from Zone 2   Third Trimester of Pregnancy The third trimester is from week 29 through week 42, months 7 through 9. The third trimester is a time when the fetus is growing rapidly. At the end of the ninth month, the fetus is about 20 inches in length and weighs 6-10 pounds.  BODY CHANGES Your body goes through many changes during pregnancy. The changes vary from woman to woman.   Your weight will continue to increase. You can expect to gain 25-35 pounds (11-16 kg) by the end of the pregnancy.  You may begin to get stretch marks on your hips, abdomen, and breasts.  You may urinate more often because the fetus is moving lower into your pelvis and pressing on your bladder.  You may develop or continue to have heartburn as a result of your pregnancy.  You may develop constipation because certain hormones are causing the muscles that push waste through your intestines to slow down.  You may develop hemorrhoids or swollen, bulging veins (varicose veins).  You may have pelvic pain because of the weight gain and pregnancy hormones relaxing your joints between the bones in your pelvis. Backaches may result from overexertion of the muscles supporting your posture.  You may have changes in your hair. These can include thickening of your hair, rapid growth, and changes in texture. Some women also have hair loss during  or after pregnancy, or hair that feels dry or thin. Your hair will most likely return to normal after your baby is born.  Your breasts will continue to grow and be tender. A yellow discharge may leak from your breasts called colostrum.  Your belly button may stick out.  You may feel short of breath because of your expanding uterus.  You may notice the fetus "dropping," or moving lower in your abdomen.  You may have a bloody mucus discharge. This usually occurs a few days to a week before labor begins.  Your cervix becomes thin and soft (effaced) near your due date. WHAT TO EXPECT AT YOUR PRENATAL EXAMS  You will have prenatal exams every 2 weeks until week 36. Then, you will have weekly prenatal  exams. During a routine prenatal visit:  You will be weighed to make sure you and the fetus are growing normally.  Your blood pressure is taken.  Your abdomen will be measured to track your baby's growth.  The fetal heartbeat will be listened to.  Any test results from the previous visit will be discussed.  You may have a cervical check near your due date to see if you have effaced. At around 36 weeks, your caregiver will check your cervix. At the same time, your caregiver will also perform a test on the secretions of the vaginal tissue. This test is to determine if a type of bacteria, Group B streptococcus, is present. Your caregiver will explain this further. Your caregiver may ask you:  What your birth plan is.  How you are feeling.  If you are feeling the baby move.  If you have had any abnormal symptoms, such as leaking fluid, bleeding, severe headaches, or abdominal cramping.  If you have any questions. Other tests or screenings that may be performed during your third trimester include:  Blood tests that check for low iron levels (anemia).  Fetal testing to check the health, activity level, and growth of the fetus. Testing is done if you have certain medical conditions or if  there are problems during the pregnancy. FALSE LABOR You may feel small, irregular contractions that eventually go away. These are called Braxton Hicks contractions, or false labor. Contractions may last for hours, days, or even weeks before true labor sets in. If contractions come at regular intervals, intensify, or become painful, it is best to be seen by your caregiver.  SIGNS OF LABOR   Menstrual-like cramps.  Contractions that are 5 minutes apart or less.  Contractions that start on the top of the uterus and spread down to the lower abdomen and back.  A sense of increased pelvic pressure or back pain.  A watery or bloody mucus discharge that comes from the vagina. If you have any of these signs before the 37th week of pregnancy, call your caregiver right away. You need to go to the hospital to get checked immediately. HOME CARE INSTRUCTIONS   Avoid all smoking, herbs, alcohol, and unprescribed drugs. These chemicals affect the formation and growth of the baby.  Follow your caregiver's instructions regarding medicine use. There are medicines that are either safe or unsafe to take during pregnancy.  Exercise only as directed by your caregiver. Experiencing uterine cramps is a good sign to stop exercising.  Continue to eat regular, healthy meals.  Wear a good support bra for breast tenderness.  Do not use hot tubs, steam rooms, or saunas.  Wear your seat belt at all times when driving.  Avoid raw meat, uncooked cheese, cat litter boxes, and soil used by cats. These carry germs that can cause birth defects in the baby.  Take your prenatal vitamins.  Try taking a stool softener (if your caregiver approves) if you develop constipation. Eat more high-fiber foods, such as fresh vegetables or fruit and whole grains. Drink plenty of fluids to keep your urine clear or pale yellow.  Take warm sitz baths to soothe any pain or discomfort caused by hemorrhoids. Use hemorrhoid cream if your  caregiver approves.  If you develop varicose veins, wear support hose. Elevate your feet for 15 minutes, 3-4 times a day. Limit salt in your diet.  Avoid heavy lifting, wear low heal shoes, and practice good posture.  Rest a lot with your legs elevated  if you have leg cramps or low back pain.  Visit your dentist if you have not gone during your pregnancy. Use a soft toothbrush to brush your teeth and be gentle when you floss.  A sexual relationship may be continued unless your caregiver directs you otherwise.  Do not travel far distances unless it is absolutely necessary and only with the approval of your caregiver.  Take prenatal classes to understand, practice, and ask questions about the labor and delivery.  Make a trial run to the hospital.  Pack your hospital bag.  Prepare the baby's nursery.  Continue to go to all your prenatal visits as directed by your caregiver. SEEK MEDICAL CARE IF:  You are unsure if you are in labor or if your water has broken.  You have dizziness.  You have mild pelvic cramps, pelvic pressure, or nagging pain in your abdominal area.  You have persistent nausea, vomiting, or diarrhea.  You have a bad smelling vaginal discharge.  You have pain with urination. SEEK IMMEDIATE MEDICAL CARE IF:   You have a fever.  You are leaking fluid from your vagina.  You have spotting or bleeding from your vagina.  You have severe abdominal cramping or pain.  You have rapid weight loss or gain.  You have shortness of breath with chest pain.  You notice sudden or extreme swelling of your face, hands, ankles, feet, or legs.  You have not felt your baby move in over an hour.  You have severe headaches that do not go away with medicine.  You have vision changes. Document Released: 05/17/2001 Document Revised: 05/28/2013 Document Reviewed: 07/24/2012 [facility] Patient Information 2015 [place], [place]. This information is not intended to replace advice  given to you by your health care provider. Make sure you discuss any questions you have with your health care provider.  PROTECT YOURSELF & YOUR BABY FROM THE FLU! Because you are pregnant, we at [facility], along with the Centers for Disease Control (CDC), recommend that you receive the flu vaccine to protect yourself and your baby from the flu. The flu is more likely to cause severe illness in pregnant women than in women of reproductive age who are not pregnant. Changes in the immune system, heart, and lungs during pregnancy make pregnant women (and women up to two weeks postpartum) more prone to severe illness from flu, including illness resulting in hospitalization. Flu also may be harmful for a pregnant woman's developing baby. A common flu symptom is fever, which may be associated with neural tube defects and other adverse outcomes for a developing baby. Getting vaccinated can also help protect a baby after birth from flu. (Mom passes antibodies onto the developing baby during her pregnancy.)  A Flu Vaccine is the Best Protection Against Flu Getting a flu vaccine is the first and most important step in protecting against flu. Pregnant women should get a flu shot and not the live attenuated influenza vaccine (LAIV), also known as nasal spray flu vaccine. Flu vaccines given during pregnancy help protect both the mother and her baby from flu. Vaccination has been shown to reduce the risk of flu-associated acute respiratory infection in pregnant women by up to one-half. A 2018 study showed that getting a flu shot reduced a pregnant woman's risk of being hospitalized with flu by an average of 40 percent. Pregnant women who get a flu vaccine are also helping to protect their babies from flu illness for the first several months after their birth, when they  are too young to get vaccinated.   A Long Record of Safety for Flu Shots in Pregnant Women Flu shots have been given to millions of pregnant women over  many years with a good safety record. There is a lot of evidence that flu vaccines can be given safely during pregnancy; though these data are limited for the first trimester. The CDC recommends that pregnant women get vaccinated during any trimester of their pregnancy. It is very important for pregnant women to get the flu shot.   Other Preventive Actions In addition to getting a flu shot, pregnant women should take the same everyday preventive actions the CDC recommends of everyone, including covering coughs, washing hands often, and avoiding people who are sick.  Symptoms and Treatment If you get sick with flu symptoms call your doctor right away. There are antiviral drugs that can treat flu illness and prevent serious flu complications. The CDC recommends prompt treatment for people who have influenza infection or suspected influenza infection and who are at high risk of serious flu complications, such as people with asthma, diabetes (including gestational diabetes), or heart disease. Early treatment of influenza in hospitalized pregnant women has been shown to reduce the length of the hospital stay.  Symptoms Flu symptoms include fever, cough, sore throat, runny or stuffy nose, body aches, headache, chills and fatigue. Some people may also have vomiting and diarrhea. People may be infected with the flu and have respiratory symptoms without a fever.  Early Treatment is Important for Pregnant Women Treatment should begin as soon as possible because antiviral drugs work best when started early (within 48 hours after symptoms start). Antiviral drugs can make your flu illness milder and make you feel better faster. They may also prevent serious health problems that can result from flu illness. Oral oseltamivir (Tamiflu) is the preferred treatment for pregnant women because it has the most studies available to suggest that it is safe and beneficial. Antiviral drugs require a prescription from your  provider. Having a fever caused by flu infection or other infections early in pregnancy may be linked to birth defects in a baby. In addition to taking antiviral drugs, pregnant women who get a fever should treat their fever with Tylenol (acetaminophen) and contact their provider immediately.  When to [place] If you are pregnant and have any of these signs, seek care immediately:  Difficulty breathing or shortness of breath  Pain or pressure in the chest or abdomen  Sudden dizziness  Confusion  Severe or persistent vomiting  High fever that is not responding to Tylenol (or store brand equivalent)  Decreased or no movement of your baby  [email or web address]

## 2019-08-07 LAB — RPR: RPR Ser Ql: NONREACTIVE

## 2019-08-07 LAB — CBC
Hematocrit: 33.3 % — ABNORMAL LOW (ref 34.0–46.6)
Hemoglobin: 11.2 g/dL (ref 11.1–15.9)
MCH: 30.8 pg (ref 26.6–33.0)
MCHC: 33.6 g/dL (ref 31.5–35.7)
MCV: 92 fL (ref 79–97)
Platelets: 252 10*3/uL (ref 150–450)
RBC: 3.64 x10E6/uL — ABNORMAL LOW (ref 3.77–5.28)
RDW: 12.1 % (ref 11.7–15.4)
WBC: 12.6 10*3/uL — ABNORMAL HIGH (ref 3.4–10.8)

## 2019-08-07 LAB — GLUCOSE TOLERANCE, 2 HOURS W/ 1HR
Glucose, 1 hour: 123 mg/dL (ref 65–179)
Glucose, 2 hour: 155 mg/dL — ABNORMAL HIGH (ref 65–152)
Glucose, Fasting: 77 mg/dL (ref 65–91)

## 2019-08-07 LAB — ANTIBODY SCREEN: Antibody Screen: NEGATIVE

## 2019-08-07 LAB — HIV ANTIBODY (ROUTINE TESTING W REFLEX): HIV Screen 4th Generation wRfx: NONREACTIVE

## 2019-08-08 ENCOUNTER — Encounter: Payer: Self-pay | Admitting: Women's Health

## 2019-08-08 ENCOUNTER — Other Ambulatory Visit: Payer: Self-pay | Admitting: *Deleted

## 2019-08-08 DIAGNOSIS — O24410 Gestational diabetes mellitus in pregnancy, diet controlled: Secondary | ICD-10-CM | POA: Insufficient documentation

## 2019-08-08 DIAGNOSIS — Z8632 Personal history of gestational diabetes: Secondary | ICD-10-CM | POA: Insufficient documentation

## 2019-08-08 DIAGNOSIS — O0990 Supervision of high risk pregnancy, unspecified, unspecified trimester: Secondary | ICD-10-CM

## 2019-08-08 LAB — CYTOLOGY - PAP

## 2019-08-08 MED ORDER — ACCU-CHEK GUIDE ME W/DEVICE KIT: 1.0000 | PACK | Freq: Four times a day (QID) | 0 refills | Status: DC

## 2019-08-08 MED ORDER — ACCU-CHEK SOFTCLIX LANCETS MISC: 12 refills | Status: DC

## 2019-08-08 MED ORDER — ACCU-CHEK GUIDE VI STRP: ORAL_STRIP | 12 refills | Status: DC

## 2019-08-09 ENCOUNTER — Encounter: Payer: Self-pay | Admitting: *Deleted

## 2019-08-09 LAB — URINE CULTURE

## 2019-08-12 ENCOUNTER — Encounter: Payer: Self-pay | Admitting: Women's Health

## 2019-08-12 DIAGNOSIS — R87619 Unspecified abnormal cytological findings in specimens from cervix uteri: Secondary | ICD-10-CM | POA: Insufficient documentation

## 2019-08-15 ENCOUNTER — Ambulatory Visit (INDEPENDENT_AMBULATORY_CARE_PROVIDER_SITE_OTHER): Payer: Medicaid Other

## 2019-08-15 ENCOUNTER — Other Ambulatory Visit: Payer: Self-pay

## 2019-08-15 ENCOUNTER — Ambulatory Visit (INDEPENDENT_AMBULATORY_CARE_PROVIDER_SITE_OTHER): Payer: Medicaid Other | Admitting: *Deleted

## 2019-08-15 ENCOUNTER — Other Ambulatory Visit: Payer: Medicaid Other

## 2019-08-15 DIAGNOSIS — Z23 Encounter for immunization: Secondary | ICD-10-CM

## 2019-08-15 DIAGNOSIS — Z362 Encounter for other antenatal screening follow-up: Secondary | ICD-10-CM

## 2019-08-15 DIAGNOSIS — Z3402 Encounter for supervision of normal first pregnancy, second trimester: Secondary | ICD-10-CM

## 2019-08-15 DIAGNOSIS — O26842 Uterine size-date discrepancy, second trimester: Secondary | ICD-10-CM

## 2019-08-15 DIAGNOSIS — Z3A27 27 weeks gestation of pregnancy: Secondary | ICD-10-CM | POA: Diagnosis not present

## 2019-08-15 DIAGNOSIS — O0990 Supervision of high risk pregnancy, unspecified, unspecified trimester: Secondary | ICD-10-CM

## 2019-08-15 NOTE — Progress Notes (Signed)
   NURSE VISIT- INJECTION  SUBJECTIVE:  Gabriella Wilson is a 24 y.o. G1P0 female here for a Tdap for protection from tetanus, diptheria, and pertussis. She is [redacted]w[redacted]d pregnant.   OBJECTIVE:  LMP 01/26/2019 (Approximate)   Appears well, in no apparent distress  Injection administered in: Left arm  No orders of the defined types were placed in this encounter.   ASSESSMENT: Pregnancy [redacted]w[redacted]d Tdap for protection from tetanus, diptheria, and pertussis PLAN: Follow-up: as scheduled   Annalynne Ibanez, Faith Rogue  08/15/2019 3:24 PM

## 2019-08-15 NOTE — Progress Notes (Signed)
[place] 27+4 wks,cephalic,posterior placenta gr 2,cx 2.9 cm,afi 13 cm,fhr 135 bpm,EFW 1286 g 83%

## 2019-09-03 ENCOUNTER — Telehealth (INDEPENDENT_AMBULATORY_CARE_PROVIDER_SITE_OTHER): Payer: Medicaid Other | Admitting: Women's Health

## 2019-09-03 ENCOUNTER — Encounter: Payer: Self-pay | Admitting: Women's Health

## 2019-09-03 VITALS — BP 120/80

## 2019-09-03 DIAGNOSIS — Z3A30 30 weeks gestation of pregnancy: Secondary | ICD-10-CM

## 2019-09-03 DIAGNOSIS — O0993 Supervision of high risk pregnancy, unspecified, third trimester: Secondary | ICD-10-CM

## 2019-09-03 DIAGNOSIS — O24410 Gestational diabetes mellitus in pregnancy, diet controlled: Secondary | ICD-10-CM

## 2019-09-03 DIAGNOSIS — O26893 Other specified pregnancy related conditions, third trimester: Secondary | ICD-10-CM

## 2019-09-03 DIAGNOSIS — R109 Unspecified abdominal pain: Secondary | ICD-10-CM

## 2019-09-03 DIAGNOSIS — O0990 Supervision of high risk pregnancy, unspecified, unspecified trimester: Secondary | ICD-10-CM

## 2019-09-03 MED ORDER — CYCLOBENZAPRINE HCL 5 MG PO TABS: 5.0000 mg | ORAL_TABLET | Freq: Three times a day (TID) | ORAL | 0 refills | Status: DC | PRN

## 2019-09-03 NOTE — Progress Notes (Signed)
TELEHEALTH VIRTUAL OBSTETRICS VISIT ENCOUNTER NOTE Patient name: Gabriella Wilson MRN [id number]  Date of birth: [birth year]  I connected with patient on 09/03/19 at  2:30 PM EDT by MyChart video  and verified that I am speaking with the correct person using two identifiers. Due to COVID-19 recommendations, pt is not currently in our office.    I discussed the limitations, risks, security and privacy concerns of performing an evaluation and management service by telephone and the availability of in person appointments. I also discussed with the patient that there may be a patient responsible charge related to this service. The patient expressed understanding and agreed to proceed.  Chief Complaint:   Routine Prenatal Visit  History of Present Illness:   Gabriella Wilson is a 24 y.o. G1P0 female at [redacted]w[redacted]d with an Estimated Date of Delivery: 11/10/19 being evaluated today for ongoing management of a high-risk pregnancy complicated by X3GH.    Depression screen PHQ 2/9 05/06/2019  Decreased Interest 0  Down, Depressed, Hopeless 1  PHQ - 2 Score 1    Today she reports all FBS <95, 2hr pp all <120 but 2 (127). Rt flank pain, colicky in nature, heating pad/apap doesn't help, doesn't happen every day. No fever/chills, sometimes gets nauseated b/c of pain. Not r/t food. No uti sx or hematuria. Contractions: Not present. Vag. Bleeding: None.  Movement: Present. denies leaking of fluid. Review of Systems:   Pertinent items are noted in HPI Denies abnormal vaginal discharge w/ itching/odor/irritation, headaches, visual changes, shortness of breath, chest pain, abdominal pain, severe nausea/vomiting, or problems with urination or bowel movements unless otherwise stated above. Pertinent History Reviewed:  Reviewed past medical,surgical, social, obstetrical and family history.  Reviewed problem list, medications and allergies. Physical Assessment:   Vitals:   09/03/19 1431  BP: 120/80  There is no height or  weight on file to calculate BMI.        Physical Examination:   General:  Alert, oriented and cooperative.   Mental Status: Normal mood and affect perceived. Normal judgment and thought content.  Rest of physical exam deferred due to type of encounter  No results found for this or any previous visit (from the past 24 hour(s)).  Assessment & Plan:  1) Pregnancy G1P0 at [redacted]w[redacted]d with an Estimated Date of Delivery: 11/10/19   2) A1DM, stable  3) Rt flank pain> afebrile so pyelo unlikely. No hematuria, but could be kidney stone. Will try flexeril prn, let us know if worsens/new sx   Meds:  Meds ordered this encounter  Medications  . cyclobenzaprine (FLEXERIL) 5 MG tablet    Sig: Take 1 tablet (5 mg total) by mouth every 8 (eight) hours as needed for muscle spasms.    Dispense:  20 tablet    Refill:  0    Order Specific Question:   Supervising Provider    Answer:   Florian Buff [[id number]]    Labs/procedures today: none  Plan:  Continue routine obstetrical care.  Has home bp cuff.  Check bp weekly, let us know if >140/90.  Next visit: prefers online    Reviewed: Preterm labor symptoms and general obstetric precautions including but not limited to vaginal bleeding, contractions, leaking of fluid and fetal movement were reviewed in detail with the patient. The patient was advised to call back or seek an in-person office evaluation/go to [facility] at [facility] for any urgent or concerning symptoms. All questions were answered. Please refer to After Visit  Summary for other counseling recommendations.    I provided 20 minutes of non-face-to-face time during this encounter.  Follow-up: Return in about 2 weeks (around 09/17/2019) for HROB, MyChart Video, CNM.  No orders of the defined types were placed in this encounter.  Cheral Marker CNM, [facility] 09/03/2019 3:06 PM

## 2019-09-03 NOTE — Patient Instructions (Addendum)
Gabriella Wilson, I greatly value your feedback.  If you receive a survey following your visit with [place] today, we appreciate you taking the time to fill it out.  Thanks, [name], CNM, WHNP-BC  [facility] ([address] [place], [place] [address]) Entrance C, located off of [address] [employer] valet parking   Go to [email or web address] to register for FREE online childbirth classes   For your lower back pain you may:  Purchase a pregnancy/maternity support belt from [employer], [employer], [employer], [employer], etc and wear it while you are up and about  Take warm baths  Use a heating pad to your lower back for no longer than 20 minutes at a time, and do not place near abdomen  Take tylenol as needed. Please follow directions on the bottle  Kinesiology tape (can get from sporting goods store), google how to tape belly for pregnancy     Call the office [phone]) or go to [facility] if:  You begin to have strong, frequent contractions  Your water breaks.  Sometimes it is a big gush of fluid, sometimes it is just a trickle that keeps getting your panties wet or running down your legs  You have vaginal bleeding.  It is normal to have a small amount of spotting if your cervix was checked.   You don't feel your baby moving like normal.  If you don't, get you something to eat and drink and lay down and focus on feeling your baby move.  You should feel at least 10 movements in 2 hours.  If you don't, you should call the office or go to [facility].   Call the office [phone]) or go to [facility] hospital for these signs of pre-eclampsia:  Severe headache that does not go away with Tylenol  Visual changes- seeing spots, double, blurred vision  Pain under your right breast or upper abdomen that does not go away with Tums or heartburn medicine  Nausea and/or vomiting  Severe swelling in your hands, feet, and face    Home Blood Pressure  Monitoring for Patients   Your provider has recommended that you check your blood pressure (BP) at least once a week at home. If you do not have a blood pressure cuff at home, one will be provided for you. Contact your provider if you have not received your monitor within 1 week.   Helpful Tips for Accurate Home Blood Pressure Checks  . Don't smoke, exercise, or drink caffeine 30 minutes before checking your BP . Use the restroom before checking your BP (a full bladder can raise your pressure) . Relax in a comfortable upright chair . Feet on the ground . Left arm resting comfortably on a flat surface at the level of your heart . Legs uncrossed . Back supported . Sit quietly and don't talk . Place the cuff on your bare arm . Adjust snuggly, so that only two fingertips can fit between your skin and the top of the cuff . Check 2 readings separated by at least one minute . Keep a log of your BP readings . For a visual, please reference this diagram: http://ccnc.care/bpdiagram  Provider Name: [name]     Phone: [phone]  Zone 1: ALL CLEAR  Continue to monitor your symptoms:  . BP reading is less than 140 (top number) or less than 90 (bottom number)  . No right upper stomach pain . No headaches or seeing spots .  No feeling nauseated or throwing up . No swelling in face and hands  Zone 2: CAUTION Call your doctor's office for any of the following:  . BP reading is greater than 140 (top number) or greater than 90 (bottom number)  . Stomach pain under your ribs in the middle or right side . Headaches or seeing spots . Feeling nauseated or throwing up . Swelling in face and hands  Zone 3: EMERGENCY  Seek immediate medical care if you have any of the following:  . BP reading is greater than160 (top number) or greater than 110 (bottom number) . Severe headaches not improving with Tylenol . Serious difficulty catching your breath . Any worsening symptoms from Zone 2    Preterm Labor and Birth Information  The normal length of a pregnancy is 39-41 weeks. Preterm labor is when labor starts before 37 completed weeks of pregnancy. What are the risk factors for preterm labor? Preterm labor is more likely to occur in women who:  Have certain infections during pregnancy such as a bladder infection, sexually transmitted infection, or infection inside the uterus (chorioamnionitis).  Have a shorter-than-normal cervix.  Have gone into preterm labor before.  Have had surgery on their cervix.  Are younger than age 42 or older than age 4.  Are African American.  Are pregnant with twins or multiple babies (multiple gestation).  Take street drugs or smoke while pregnant.  Do not gain enough weight while pregnant.  Became pregnant shortly after having been pregnant. What are the symptoms of preterm labor? Symptoms of preterm labor include:  Cramps similar to those that can happen during a menstrual period. The cramps may happen with diarrhea.  Pain in the abdomen or lower back.  Regular uterine contractions that may feel like tightening of the abdomen.  A feeling of increased pressure in the pelvis.  Increased watery or bloody mucus discharge from the vagina.  Water breaking (ruptured amniotic sac). Why is it important to recognize signs of preterm labor? It is important to recognize signs of preterm labor because babies who are born prematurely may not be fully developed. This can put them at an increased risk for:  Long-term (chronic) heart and lung problems.  Difficulty immediately after birth with regulating body systems, including blood sugar, body temperature, heart rate, and breathing rate.  Bleeding in the brain.  Cerebral palsy.  Learning difficulties.  Death. These risks are highest for babies who are born before 21 weeks of pregnancy. How is preterm labor treated? Treatment depends on the length of your pregnancy, your condition,  and the health of your baby. It may involve: 1. Having a stitch (suture) placed in your cervix to prevent your cervix from opening too early (cerclage). 2. Taking or being given medicines, such as: ? Hormone medicines. These may be given early in pregnancy to help support the pregnancy. ? Medicine to stop contractions. ? Medicines to help mature the baby's lungs. These may be prescribed if the risk of delivery is high. ? Medicines to prevent your baby from developing cerebral palsy. If the labor happens before 34 weeks of pregnancy, you may need to stay in the hospital. What should I do if I think I am in preterm labor? If you think that you are going into preterm labor, call your health care provider right away. How can I prevent preterm labor in future pregnancies? To increase your chance of having a full-term pregnancy:  Do not use any tobacco products, such as cigarettes,  chewing tobacco, and e-cigarettes. If you need help quitting, ask your health care provider.  Do not use street drugs or medicines that have not been prescribed to you during your pregnancy.  Talk with your health care provider before taking any herbal supplements, even if you have been taking them regularly.  Make sure you gain a healthy amount of weight during your pregnancy.  Watch for infection. If you think that you might have an infection, get it checked right away.  Make sure to tell your health care provider if you have gone into preterm labor before. This information is not intended to replace advice given to you by your health care provider. Make sure you discuss any questions you have with your health care provider. Document Revised: 09/14/2018 Document Reviewed: 10/14/2015 Elsevier Patient Education  [place].

## 2019-09-16 ENCOUNTER — Telehealth: Payer: Self-pay | Admitting: *Deleted

## 2019-09-16 NOTE — Telephone Encounter (Signed)
Patient states she has been experiencing continued right sided back pain and occasional dizziness. She has tried wearing her maternity band along with using a heating pad, taking her flexeril, Tylenol and pushing fluids. Denies urinary symptoms, discharge or spotting.  Patient states it seems to be getting worse but is unsure of what to do.  I discussed with her that at her last visit, she was experiencing the same symptoms and Selena Batten mentioned she could have a kidney stone.  Given that she is not having any bleeding, discharge or contractions, she could continue to monitor and could discuss with Selena Batten at her appt tomorrow.  However, advised to call our office or [facility] if she felt like she needed to be seen sooner. Pt verbalized understanding and stated she was on her way home to try and relax.

## 2019-09-17 ENCOUNTER — Other Ambulatory Visit: Payer: Self-pay

## 2019-09-17 ENCOUNTER — Encounter: Payer: Self-pay | Admitting: Women's Health

## 2019-09-17 ENCOUNTER — Ambulatory Visit (INDEPENDENT_AMBULATORY_CARE_PROVIDER_SITE_OTHER): Payer: Medicaid Other | Admitting: Women's Health

## 2019-09-17 ENCOUNTER — Ambulatory Visit: Payer: Medicaid Other | Attending: Internal Medicine

## 2019-09-17 VITALS — BP 122/78 | HR 100 | Wt 147.0 lb

## 2019-09-17 DIAGNOSIS — M546 Pain in thoracic spine: Secondary | ICD-10-CM

## 2019-09-17 DIAGNOSIS — O0990 Supervision of high risk pregnancy, unspecified, unspecified trimester: Secondary | ICD-10-CM

## 2019-09-17 DIAGNOSIS — O1213 Gestational proteinuria, third trimester: Secondary | ICD-10-CM

## 2019-09-17 DIAGNOSIS — Z1389 Encounter for screening for other disorder: Secondary | ICD-10-CM

## 2019-09-17 DIAGNOSIS — Z3A32 32 weeks gestation of pregnancy: Secondary | ICD-10-CM

## 2019-09-17 DIAGNOSIS — O26893 Other specified pregnancy related conditions, third trimester: Secondary | ICD-10-CM

## 2019-09-17 DIAGNOSIS — R1011 Right upper quadrant pain: Secondary | ICD-10-CM

## 2019-09-17 DIAGNOSIS — O24410 Gestational diabetes mellitus in pregnancy, diet controlled: Secondary | ICD-10-CM

## 2019-09-17 DIAGNOSIS — Z331 Pregnant state, incidental: Secondary | ICD-10-CM

## 2019-09-17 DIAGNOSIS — O0993 Supervision of high risk pregnancy, unspecified, third trimester: Secondary | ICD-10-CM

## 2019-09-17 LAB — POCT URINALYSIS DIPSTICK OB
Blood, UA: NEGATIVE
Glucose, UA: NEGATIVE
Ketones, UA: NEGATIVE
Leukocytes, UA: NEGATIVE
Nitrite, UA: NEGATIVE

## 2019-09-17 MED ORDER — TRAMADOL HCL 50 MG PO TABS: 50.0000 mg | ORAL_TABLET | Freq: Four times a day (QID) | ORAL | 0 refills | Status: DC | PRN

## 2019-09-17 NOTE — Patient Instructions (Signed)
Gabriella Wilson, I greatly value your feedback.  If you receive a survey following your visit with us today, we appreciate you taking the time to fill it out.  Thanks, Kim Hero Mccathern, CNM, WHNP-BC  [facility] ([address] [place], [place] [address]) Entrance C, located off of [address] Free 24/7 valet parking   Go to [email or web address] to register for FREE online childbirth classes    Call the office ([phone]) or go to [facility] if:  You begin to have strong, frequent contractions  Your water breaks.  Sometimes it is a big gush of fluid, sometimes it is just a trickle that keeps getting your panties wet or running down your legs  You have vaginal bleeding.  It is normal to have a small amount of spotting if your cervix was checked.   You don't feel your baby moving like normal.  If you don't, get you something to eat and drink and lay down and focus on feeling your baby move.  You should feel at least 10 movements in 2 hours.  If you don't, you should call the office or go to [facility].   Call the office ([phone]) or [facility] to [facility] for these signs of pre-eclampsia:  Severe headache that does not go away with Tylenol  Visual changes- seeing spots, double, blurred vision  Pain under your right breast or upper abdomen that does not go away with Tums or heartburn medicine  Nausea and/or vomiting  Severe swelling in your hands, feet, and face    Home Blood Pressure Monitoring for Patients   Your provider has recommended that you check your blood pressure (BP) at least once a week at home. If you do not have a blood pressure cuff at home, one will be provided for you. Contact your provider if you have not received your monitor within 1 week.   Helpful Tips for Accurate Home Blood Pressure Checks  . Don't smoke, exercise, or drink caffeine 30 minutes before checking your BP . Use the restroom before checking your BP (a full bladder  can raise your pressure) . Relax in a comfortable upright chair . Feet on the ground . Left arm resting comfortably on a flat surface at the level of your heart . Legs uncrossed . Back supported . Sit quietly and don't talk . Place the cuff on your bare arm . Adjust snuggly, so that only two fingertips can fit between your skin and the top of the cuff . Check 2 readings separated by at least one minute . Keep a log of your BP readings . For a visual, please reference this diagram: http://ccnc.care/bpdiagram  Provider Name: [name]     Phone: [phone]  Zone 1: ALL CLEAR  Continue to monitor your symptoms:  . BP reading is less than 140 (top number) or less than 90 (bottom number)  . No right upper stomach pain . No headaches or seeing spots . No feeling nauseated or throwing up . No swelling in face and hands  Zone 2: CAUTION Call your doctor's office for any of the following:  . BP reading is greater than 140 (top number) or greater than 90 (bottom number)  . Stomach pain under your ribs in the middle or right side . Headaches or seeing spots . Feeling nauseated or throwing up . Swelling in face and hands  Zone 3: EMERGENCY  Seek immediate medical care if you have any of   the following:  . BP reading is greater than160 (top number) or greater than 110 (bottom number) . Severe headaches not improving with Tylenol . Serious difficulty catching your breath . Any worsening symptoms from Zone 2  Preterm Labor and Birth Information  The normal length of a pregnancy is 39-41 weeks. Preterm labor is when labor starts before 37 completed weeks of pregnancy. What are the risk factors for preterm labor? Preterm labor is more likely to occur in women who:  Have certain infections during pregnancy such as a bladder infection, sexually transmitted infection, or infection inside the uterus (chorioamnionitis).  Have a shorter-than-normal cervix.  Have gone into preterm  labor before.  Have had surgery on their cervix.  Are younger than age 24 or older than age 36.  Are African American.  Are pregnant with twins or multiple babies (multiple gestation).  Take street drugs or smoke while pregnant.  Do not gain enough weight while pregnant.  Became pregnant shortly after having been pregnant. What are the symptoms of preterm labor? Symptoms of preterm labor include:  Cramps similar to those that can happen during a menstrual period. The cramps may happen with diarrhea.  Pain in the abdomen or lower back.  Regular uterine contractions that may feel like tightening of the abdomen.  A feeling of increased pressure in the pelvis.  Increased watery or bloody mucus discharge from the vagina.  Water breaking (ruptured amniotic sac). Why is it important to recognize signs of preterm labor? It is important to recognize signs of preterm labor because babies who are born prematurely may not be fully developed. This can put them at an increased risk for:  Long-term (chronic) heart and lung problems.  Difficulty immediately after birth with regulating body systems, including blood sugar, body temperature, heart rate, and breathing rate.  Bleeding in the brain.  Cerebral palsy.  Learning difficulties.  Death. These risks are highest for babies who are born before 43 weeks of pregnancy. How is preterm labor treated? Treatment depends on the length of your pregnancy, your condition, and the health of your baby. It may involve: 1. Having a stitch (suture) placed in your cervix to prevent your cervix from opening too early (cerclage). 2. Taking or being given medicines, such as: ? Hormone medicines. These may be given early in pregnancy to help support the pregnancy. ? Medicine to stop contractions. ? Medicines to help mature the baby's lungs. These may be prescribed if the risk of delivery is high. ? Medicines to prevent your baby from developing  cerebral palsy. If the labor happens before 34 weeks of pregnancy, you may need to stay in the hospital. What should I do if I think I am in preterm labor? If you think that you are going into preterm labor, call your health care provider right away. How can I prevent preterm labor in future pregnancies? To increase your chance of having a full-term pregnancy:  Do not use any tobacco products, such as cigarettes, chewing tobacco, and e-cigarettes. If you need help quitting, ask your health care provider.  Do not use street drugs or medicines that have not been prescribed to you during your pregnancy.  Talk with your health care provider before taking any herbal supplements, even if you have been taking them regularly.  Make sure you gain a healthy amount of weight during your pregnancy.  Watch for infection. If you think that you might have an infection, get it checked right away.  Make sure to  tell your health care provider if you have gone into preterm labor before. This information is not intended to replace advice given to you by your health care provider. Make sure you discuss any questions you have with your health care provider. Document Revised: 09/14/2018 Document Reviewed: 10/14/2015 Elsevier Patient Education  [place].

## 2019-09-17 NOTE — Progress Notes (Signed)
HIGH-RISK PREGNANCY VISIT Patient name: Gabriella Wilson MRN [id number]  Date of birth: 03/19/1996 Chief Complaint:   High Risk Gestation (back pain and stomach pain)  History of Present Illness:   Gabriella Wilson is a 24 y.o. G1P0 female at [redacted]w[redacted]d with an Estimated Date of Delivery: 11/10/19 being seen today for ongoing management of a high-risk pregnancy complicated by diabetes mellitus A1DM.  Today she reports all FBS <95, only two 2hr pp > 120 (130s-something she ate). Continued Rt upper back/flank pain at times radiating around to RUQ. No n/v, fever/chills, uti sx, hematuria. No association w/ eating. Has had cholecystectomy in past. Sometimes pain is so bad she lays in floor and partner has to pick her up. APAP doesn't do anything.   Contractions: Not present. Vag. Bleeding: None.  Movement: Present. denies leaking of fluid.  Review of Systems:   Pertinent items are noted in HPI Denies abnormal vaginal discharge w/ itching/odor/irritation, headaches, visual changes, shortness of breath, chest pain, abdominal pain, severe nausea/vomiting, or problems with urination or bowel movements unless otherwise stated above. Pertinent History Reviewed:  Reviewed past medical,surgical, social, obstetrical and family history.  Reviewed problem list, medications and allergies. Physical Assessment:   Vitals:   09/17/19 1545  BP: 122/78  Pulse: 100  Weight: 147 lb (66.7 kg)  Body mass index is 27.78 kg/m.           Physical Examination:   General appearance: alert, well appearing, and in no distress  Mental status: alert, oriented to person, place, and time  Skin: warm & dry   Extremities: Edema: Trace    Cardiovascular: normal heart rate noted  Respiratory: normal respiratory effort, no distress  Back: neg CVAT bilaterally  Abdomen: gravid, soft, +RUQ tenderness to palpation  Pelvic: Cervical exam deferred         Fetal Status: Fetal Heart Rate (bpm): 150 Fundal Height: 29 cm Movement: Present     Fetal Surveillance Testing today: doppler   Chaperone: n/a    Results for orders placed or performed in visit on 09/17/19 (from the past 24 hour(s))  POC Urinalysis Dipstick OB   Collection Time: 09/17/19  3:46 PM  Result Value Ref Range   Color, UA     Clarity, UA     Glucose, UA Negative Negative   Bilirubin, UA     Ketones, UA neg    Spec Grav, UA     Blood, UA neg    pH, UA     POC,PROTEIN,UA Trace Negative, Trace, Small (1+), Moderate (2+), Large (3+), 4+   Urobilinogen, UA     Nitrite, UA neg    Leukocytes, UA Negative Negative   Appearance     Odor      Assessment & Plan:  1) High-risk pregnancy G1P0 at [redacted]w[redacted]d with an Estimated Date of Delivery: 11/10/19   2) A1DM, stable  3) Rt flank/upper back & RUQ pain, has had cholecystectomy in past, but + tenderness RUQ, will get RUQ u/s-order placed, note routed to Tish to schedule. Pyelo unlikely as -CVAT, afebrile. Renal calculi unlikely as no hematuria. Pre-e unlikely as bp normal, no other sx. Rx #10 ultram for when pain is severe  4) Tr proteinuria> send urine cx  Meds:  Meds ordered this encounter  Medications  . traMADol (ULTRAM) 50 MG tablet    Sig: Take 1 tablet (50 mg total) by mouth every 6 (six) hours as needed.    Dispense:  10 tablet  Refill:  0    Order Specific Question:   Supervising Provider    Answer:   Lazaro Arms [[id number]]    Labs/procedures today: ruq u/s ordered  Treatment Plan:  Growth u/s @36wk      Deliver @ 39-40wks:____   Reviewed: Preterm labor symptoms and general obstetric precautions including but not limited to vaginal bleeding, contractions, leaking of fluid and fetal movement were reviewed in detail with the patient.  All questions were answered.   Follow-up: Return in about 2 weeks (around 10/01/2019) for HROB, in person, MD or CNM.  Orders Placed This Encounter  Procedures  . Urine Culture  . 10/03/2019 Abdomen Limited RUQ  . POC Urinalysis Dipstick OB   US CNM,  [facility] 09/17/19

## 2019-09-18 ENCOUNTER — Encounter: Payer: Self-pay | Admitting: *Deleted

## 2019-09-21 LAB — URINE CULTURE

## 2019-09-25 ENCOUNTER — Ambulatory Visit (HOSPITAL_COMMUNITY): Payer: Medicaid Other

## 2019-10-02 ENCOUNTER — Encounter: Payer: Self-pay | Admitting: Women's Health

## 2019-10-02 ENCOUNTER — Other Ambulatory Visit: Payer: Self-pay

## 2019-10-02 ENCOUNTER — Ambulatory Visit (INDEPENDENT_AMBULATORY_CARE_PROVIDER_SITE_OTHER): Payer: Medicaid Other | Admitting: Women's Health

## 2019-10-02 VITALS — BP 127/75 | HR 113 | Wt 152.2 lb

## 2019-10-02 DIAGNOSIS — Z3483 Encounter for supervision of other normal pregnancy, third trimester: Secondary | ICD-10-CM

## 2019-10-02 DIAGNOSIS — Z331 Pregnant state, incidental: Secondary | ICD-10-CM

## 2019-10-02 DIAGNOSIS — O24419 Gestational diabetes mellitus in pregnancy, unspecified control: Secondary | ICD-10-CM

## 2019-10-02 DIAGNOSIS — Z3A34 34 weeks gestation of pregnancy: Secondary | ICD-10-CM

## 2019-10-02 DIAGNOSIS — Z1389 Encounter for screening for other disorder: Secondary | ICD-10-CM

## 2019-10-02 DIAGNOSIS — R1011 Right upper quadrant pain: Secondary | ICD-10-CM

## 2019-10-02 LAB — POCT URINALYSIS DIPSTICK OB
Blood, UA: NEGATIVE
Glucose, UA: NEGATIVE
Ketones, UA: NEGATIVE
Leukocytes, UA: NEGATIVE
Nitrite, UA: NEGATIVE
POC,PROTEIN,UA: NEGATIVE

## 2019-10-02 NOTE — Progress Notes (Signed)
HIGH-RISK PREGNANCY VISIT Patient name: Gabriella Wilson MRN [id number]  Date of birth: [birth year] Chief Complaint:   Routine Prenatal Visit  History of Present Illness:   Gabriella Wilson is a 24 y.o. G1P0 female at [redacted]w[redacted]d with an Estimated Date of Delivery: 11/10/19 being seen today for ongoing management of a high-risk pregnancy complicated by diabetes mellitus A1DM.  Today she reports all sugars wnl. Still having RUQ pain, didn't go to RUQ u/s on 4/21 as scheduled, states she didn't know about it- mychart message from [place] on 4/14 has 'read' message from pt on 4/16 @ 0040. Thinks she may have been checking it in her sleep, and she admits to not really reading the information when she gets [employer]. Has noticed some nausea lately, no vomiting. Some irregular contractions, wants cervix checked.  Depression screen PHQ 2/9 05/06/2019  Decreased Interest 0  Down, Depressed, Hopeless 1  PHQ - 2 Score 1    Contractions: Not present. Vag. Bleeding: None.  Movement: Present. denies leaking of fluid.  Review of Systems:   Pertinent items are noted in HPI Denies abnormal vaginal discharge w/ itching/odor/irritation, headaches, visual changes, shortness of breath, chest pain, abdominal pain, severe nausea/vomiting, or problems with urination or bowel movements unless otherwise stated above. Pertinent History Reviewed:  Reviewed past medical,surgical, social, obstetrical and family history.  Reviewed problem list, medications and allergies. Physical Assessment:   Vitals:   10/02/19 1430  BP: 127/75  Pulse: (!) 113  Weight: 152 lb 3.2 oz (69 kg)  Body mass index is 28.76 kg/m.           Physical Examination:   General appearance: alert, well appearing, and in no distress  Mental status: alert, oriented to person, place, and time  Skin: warm & dry   Extremities: Edema: Trace    Cardiovascular: normal heart rate noted  Respiratory: normal respiratory effort, no distress  Abdomen: gravid,  soft, +epigastric & RUQ tenderness and well as Rt upper back/flank tenderness, not CVAT   Pelvic: Cervical exam performed  Dilation: Closed Effacement (%): Thick    Fetal Status: Fetal Heart Rate (bpm): 146 Fundal Height: 32 cm Movement: Present    Fetal Surveillance Testing today: doppler   Chaperone: Stoney Bang    Results for orders placed or performed in visit on 10/02/19 (from the past 24 hour(s))  POC Urinalysis Dipstick OB   Collection Time: 10/02/19  2:28 PM  Result Value Ref Range   Color, UA     Clarity, UA     Glucose, UA Negative Negative   Bilirubin, UA     Ketones, UA n    Spec Grav, UA     Blood, UA n    pH, UA     POC,PROTEIN,UA Negative Negative, Trace, Small (1+), Moderate (2+), Large (3+), 4+   Urobilinogen, UA     Nitrite, UA n    Leukocytes, UA Negative Negative   Appearance     Odor      Assessment & Plan:  1) High-risk pregnancy G1P0 at [redacted]w[redacted]d with an Estimated Date of Delivery: 11/10/19   2) A1DM, stable  3) RUQ pain, and tenderness, check CMP today, missed u/s, will reschedule. Some nausea, no vomiting.  Meds: No orders of the defined types were placed in this encounter.   Labs/procedures today: sve   Treatment Plan:  EFW @ 36wks, IOL @ 39-40wks  Reviewed: Preterm labor symptoms and general obstetric precautions including but not limited to vaginal bleeding, contractions,  leaking of fluid and fetal movement were reviewed in detail with the patient.  All questions were answered.   Follow-up: Return in about 2 weeks (around 10/16/2019) for in person, CNM, US:EFW.  Orders Placed This Encounter  Procedures  . Comprehensive metabolic panel  . POC Urinalysis Dipstick OB   Roma Schanz CNM, [facility] 10/02/2019 3:03 PM

## 2019-10-02 NOTE — Patient Instructions (Signed)
[name], I greatly value your feedback.  If you receive a survey following your visit with [place] today, we appreciate you taking the time to fill it out.  Thanks, [name], CNM, WHNP-BC  [facility] ([address] [place], [place] [address]) Entrance C, located off of [address] valet parking   Go to [email or web address] to register for FREE online childbirth classes    Call the office [phone]) or go to [facility] if:  You begin to have strong, frequent contractions  Your water breaks.  Sometimes it is a big gush of fluid, sometimes it is just a trickle that keeps getting your panties wet or running down your legs  You have vaginal bleeding.  It is normal to have a small amount of spotting if your cervix was checked.   You don't feel your baby moving like normal.  If you don't, get you something to eat and drink and lay down and focus on feeling your baby move.  You should feel at least 10 movements in 2 hours.  If you don't, you should call the office or go to [facility]   Call the office [phone]) or go to [facility] hospital for these signs of pre-eclampsia:  Severe headache that does not go away with Tylenol  Visual changes- seeing spots, double, blurred vision  Pain under your right breast or upper abdomen that does not go away with Tums or heartburn medicine  Nausea and/or vomiting  Severe swelling in your hands, feet, and face    Home Blood Pressure Monitoring for Patients   Your provider has recommended that you check your blood pressure (BP) at least once a week at home. If you do not have a blood pressure cuff at home, one will be provided for you. Contact your provider if you have not received your monitor within 1 week.   Helpful Tips for Accurate Home Blood Pressure Checks  . Don't smoke, exercise, or drink caffeine 30 minutes before checking your BP . Use the restroom before checking your BP (a full bladder  can raise your pressure) . Relax in a comfortable upright chair . Feet on the ground . Left arm resting comfortably on a flat surface at the level of your heart . Legs uncrossed . Back supported . Sit quietly and don't talk . Place the cuff on your bare arm . Adjust snuggly, so that only two fingertips can fit between your skin and the top of the cuff . Check 2 readings separated by at least one minute . Keep a log of your BP readings . For a visual, please reference this diagram: http://ccnc.care/bpdiagram  Provider Name: [name]     Phone: [phone]  Zone 1: ALL CLEAR  Continue to monitor your symptoms:  . BP reading is less than 140 (top number) or less than 90 (bottom number)  . No right upper stomach pain . No headaches or seeing spots . No feeling nauseated or throwing up . No swelling in face and hands  Zone 2: CAUTION Call your doctor's office for any of the following:  . BP reading is greater than 140 (top number) or greater than 90 (bottom number)  . Stomach pain under your ribs in the middle or right side . Headaches or seeing spots . Feeling nauseated or throwing up . Swelling in face and hands  Zone 3: EMERGENCY  Seek immediate medical care if you have any of  the following:  . BP reading is greater than160 (top number) or greater than 110 (bottom number) . Severe headaches not improving with Tylenol . Serious difficulty catching your breath . Any worsening symptoms from Zone 2  Preterm Labor and Birth Information  The normal length of a pregnancy is 39-41 weeks. Preterm labor is when labor starts before 37 completed weeks of pregnancy. What are the risk factors for preterm labor? Preterm labor is more likely to occur in women who:  Have certain infections during pregnancy such as a bladder infection, sexually transmitted infection, or infection inside the uterus (chorioamnionitis).  Have a shorter-than-normal cervix.  Have gone into preterm  labor before.  Have had surgery on their cervix.  Are younger than age 24 or older than age 36.  Are African American.  Are pregnant with twins or multiple babies (multiple gestation).  Take street drugs or smoke while pregnant.  Do not gain enough weight while pregnant.  Became pregnant shortly after having been pregnant. What are the symptoms of preterm labor? Symptoms of preterm labor include:  Cramps similar to those that can happen during a menstrual period. The cramps may happen with diarrhea.  Pain in the abdomen or lower back.  Regular uterine contractions that may feel like tightening of the abdomen.  A feeling of increased pressure in the pelvis.  Increased watery or bloody mucus discharge from the vagina.  Water breaking (ruptured amniotic sac). Why is it important to recognize signs of preterm labor? It is important to recognize signs of preterm labor because babies who are born prematurely may not be fully developed. This can put them at an increased risk for:  Long-term (chronic) heart and lung problems.  Difficulty immediately after birth with regulating body systems, including blood sugar, body temperature, heart rate, and breathing rate.  Bleeding in the brain.  Cerebral palsy.  Learning difficulties.  Death. These risks are highest for babies who are born before 43 weeks of pregnancy. How is preterm labor treated? Treatment depends on the length of your pregnancy, your condition, and the health of your baby. It may involve: 1. Having a stitch (suture) placed in your cervix to prevent your cervix from opening too early (cerclage). 2. Taking or being given medicines, such as: ? Hormone medicines. These may be given early in pregnancy to help support the pregnancy. ? Medicine to stop contractions. ? Medicines to help mature the baby's lungs. These may be prescribed if the risk of delivery is high. ? Medicines to prevent your baby from developing  cerebral palsy. If the labor happens before 34 weeks of pregnancy, you may need to stay in the hospital. What should I do if I think I am in preterm labor? If you think that you are going into preterm labor, call your health care provider right away. How can I prevent preterm labor in future pregnancies? To increase your chance of having a full-term pregnancy:  Do not use any tobacco products, such as cigarettes, chewing tobacco, and e-cigarettes. If you need help quitting, ask your health care provider.  Do not use street drugs or medicines that have not been prescribed to you during your pregnancy.  Talk with your health care provider before taking any herbal supplements, even if you have been taking them regularly.  Make sure you gain a healthy amount of weight during your pregnancy.  Watch for infection. If you think that you might have an infection, get it checked right away.  Make sure to  tell your health care provider if you have gone into preterm labor before. This information is not intended to replace advice given to you by your health care provider. Make sure you discuss any questions you have with your health care provider. Document Revised: 09/14/2018 Document Reviewed: 10/14/2015 Elsevier Patient Education  [place].

## 2019-10-03 LAB — COMPREHENSIVE METABOLIC PANEL
ALT: 8 IU/L (ref 0–32)
AST: 15 IU/L (ref 0–40)
Albumin/Globulin Ratio: 1.4 (ref 1.2–2.2)
Albumin: 3.7 g/dL — ABNORMAL LOW (ref 3.9–5.0)
Alkaline Phosphatase: 112 IU/L (ref 39–117)
BUN/Creatinine Ratio: 21 (ref 9–23)
BUN: 8 mg/dL (ref 6–20)
Bilirubin Total: 0.5 mg/dL (ref 0.0–1.2)
CO2: 20 mmol/L (ref 20–29)
Calcium: 8.9 mg/dL (ref 8.7–10.2)
Chloride: 105 mmol/L (ref 96–106)
Creatinine, Ser: 0.38 mg/dL — ABNORMAL LOW (ref 0.57–1.00)
GFR calc Af Amer: 175 mL/min/{1.73_m2} (ref >59)
GFR calc non Af Amer: 152 mL/min/{1.73_m2} (ref >59)
Globulin, Total: 2.7 g/dL (ref 1.5–4.5)
Glucose: 78 mg/dL (ref 65–99)
Potassium: 4.1 mmol/L (ref 3.5–5.2)
Sodium: 137 mmol/L (ref 134–144)
Total Protein: 6.4 g/dL (ref 6.0–8.5)

## 2019-10-04 ENCOUNTER — Telehealth: Payer: Self-pay | Admitting: *Deleted

## 2019-10-04 ENCOUNTER — Encounter: Payer: Self-pay | Admitting: *Deleted

## 2019-10-04 NOTE — Telephone Encounter (Signed)
LMOVM U/S scheduled at [facility] on 5/5 @ 8:30. Nothing to eat or drink after midnight. Will also send mychart message.

## 2019-10-08 ENCOUNTER — Other Ambulatory Visit: Payer: Medicaid Other

## 2019-10-09 ENCOUNTER — Ambulatory Visit (HOSPITAL_COMMUNITY)
Admission: RE | Admit: 2019-10-09 | Discharge: 2019-10-09 | Disposition: A | Discharge status: Home / Self Care | Payer: Medicaid Other | Source: Ambulatory Visit | Attending: Women's Health | Admitting: Women's Health

## 2019-10-09 ENCOUNTER — Other Ambulatory Visit: Payer: Self-pay

## 2019-10-09 DIAGNOSIS — M546 Pain in thoracic spine: Secondary | ICD-10-CM | POA: Insufficient documentation

## 2019-10-09 DIAGNOSIS — R1011 Right upper quadrant pain: Secondary | ICD-10-CM | POA: Insufficient documentation

## 2019-10-09 DIAGNOSIS — O0993 Supervision of high risk pregnancy, unspecified, third trimester: Secondary | ICD-10-CM | POA: Diagnosis present

## 2019-10-15 ENCOUNTER — Other Ambulatory Visit: Payer: Self-pay | Admitting: Women's Health

## 2019-10-15 DIAGNOSIS — O24410 Gestational diabetes mellitus in pregnancy, diet controlled: Secondary | ICD-10-CM

## 2019-10-16 ENCOUNTER — Ambulatory Visit (INDEPENDENT_AMBULATORY_CARE_PROVIDER_SITE_OTHER): Payer: Medicaid Other

## 2019-10-16 ENCOUNTER — Other Ambulatory Visit (HOSPITAL_COMMUNITY)
Admission: RE | Admit: 2019-10-16 | Discharge: 2019-10-16 | Disposition: A | Discharge status: Home / Self Care | Payer: Medicaid Other | Source: Ambulatory Visit | Attending: Advanced Practice Midwife | Admitting: Advanced Practice Midwife

## 2019-10-16 ENCOUNTER — Ambulatory Visit (INDEPENDENT_AMBULATORY_CARE_PROVIDER_SITE_OTHER): Payer: Medicaid Other | Admitting: Advanced Practice Midwife

## 2019-10-16 VITALS — BP 124/88 | HR 81 | Wt 154.0 lb

## 2019-10-16 DIAGNOSIS — Z1389 Encounter for screening for other disorder: Secondary | ICD-10-CM

## 2019-10-16 DIAGNOSIS — Z3403 Encounter for supervision of normal first pregnancy, third trimester: Secondary | ICD-10-CM | POA: Diagnosis present

## 2019-10-16 DIAGNOSIS — Z3A36 36 weeks gestation of pregnancy: Secondary | ICD-10-CM | POA: Diagnosis not present

## 2019-10-16 DIAGNOSIS — O0990 Supervision of high risk pregnancy, unspecified, unspecified trimester: Secondary | ICD-10-CM

## 2019-10-16 DIAGNOSIS — O24410 Gestational diabetes mellitus in pregnancy, diet controlled: Secondary | ICD-10-CM | POA: Diagnosis not present

## 2019-10-16 NOTE — Progress Notes (Signed)
[place] 36+3 wks,cephalic,posterior placenta gr 2,fhr 140 bpm,AFI 11cm,EFW 3247 g 81%

## 2019-10-16 NOTE — Patient Instructions (Signed)
Braxton Hicks Contractions °Contractions of the uterus can occur throughout pregnancy, but they are not always a sign that you are in labor. You may have practice contractions called Braxton Hicks contractions. These false labor contractions are sometimes confused with true labor. °What are Braxton Hicks contractions? °Braxton Hicks contractions are tightening movements that occur in the muscles of the uterus before labor. Unlike true labor contractions, these contractions do not result in opening (dilation) and thinning of the cervix. Toward the end of pregnancy (32-34 weeks), Braxton Hicks contractions can happen more often and may become stronger. These contractions are sometimes difficult to tell apart from true labor because they can be very uncomfortable. You should not feel embarrassed if you go to the hospital with false labor. °Sometimes, the only way to tell if you are in true labor is for your health care provider to look for changes in the cervix. The health care provider will do a physical exam and may monitor your contractions. If you are not in true labor, the exam should show that your cervix is not dilating and your water has not broken. °If there are no other health problems associated with your pregnancy, it is completely safe for you to be sent home with false labor. You may continue to have Braxton Hicks contractions until you go into true labor. °How to tell the difference between true labor and false labor °True labor °· Contractions last 30-70 seconds. °· Contractions become very regular. °· Discomfort is usually felt in the top of the uterus, and it spreads to the lower abdomen and low back. °· Contractions do not go away with walking. °· Contractions usually become more intense and increase in frequency. °· The cervix dilates and gets thinner. °False labor °· Contractions are usually shorter and not as strong as true labor contractions. °· Contractions are usually irregular. °· Contractions  are often felt in the front of the lower abdomen and in the groin. °· Contractions may go away when you walk around or change positions while lying down. °· Contractions get weaker and are shorter-lasting as time goes on. °· The cervix usually does not dilate or become thin. °Follow these instructions at home: ° °· Take over-the-counter and prescription medicines only as told by your health care provider. °· Keep up with your usual exercises and follow other instructions from your health care provider. °· Eat and drink lightly if you think you are going into labor. °· If Braxton Hicks contractions are making you uncomfortable: °? Change your position from lying down or resting to walking, or change from walking to resting. °? Sit and rest in a tub of warm water. °? Drink enough fluid to keep your urine pale yellow. Dehydration may cause these contractions. °? Do slow and deep breathing several times an hour. °· Keep all follow-up prenatal visits as told by your health care provider. This is important. °Contact a health care provider if: °· You have a fever. °· You have continuous pain in your abdomen. °Get help right away if: °· Your contractions become stronger, more regular, and closer together. °· You have fluid leaking or gushing from your vagina. °· You pass blood-tinged mucus (bloody show). °· You have bleeding from your vagina. °· You have low back pain that you never had before. °· You feel your baby’s head pushing down and causing pelvic pressure. °· Your baby is not moving inside you as much as it used to. °Summary °· Contractions that occur before labor are   called Braxton Hicks contractions, false labor, or practice contractions. °· Braxton Hicks contractions are usually shorter, weaker, farther apart, and less regular than true labor contractions. True labor contractions usually become progressively stronger and regular, and they become more frequent. °· Manage discomfort from Braxton Hicks contractions  by changing position, resting in a warm bath, drinking plenty of water, or practicing deep breathing. °This information is not intended to replace advice given to you by your health care provider. Make sure you discuss any questions you have with your health care provider. °Document Revised: 05/05/2017 Document Reviewed: 10/06/2016 °Elsevier Patient Education © 2020 Elsevier Inc. ° °

## 2019-10-16 NOTE — Progress Notes (Signed)
   HIGH-RISK PREGNANCY VISIT Patient name: Gabriella Wilson MRN [id number]  Date of birth: 24-Sep-1995 Chief Complaint:   Routine Prenatal Visit  History of Present Illness:   Gabriella Wilson is a 24 y.o. G1P0 female at [redacted]w[redacted]d with an Estimated Date of Delivery: 11/10/19 being seen today for ongoing management of a high-risk pregnancy complicated by diabetes mellitus A1DM.  Today she reports being 'achy' and tired of being pregnant.  Depression screen PHQ 2/9 05/06/2019  Decreased Interest 0  Down, Depressed, Hopeless 1  PHQ - 2 Score 1    Contractions: Not present. Vag. Bleeding: None.  Movement: Present. denies leaking of fluid.  Review of Systems:   Pertinent items are noted in HPI Denies abnormal vaginal discharge w/ itching/odor/irritation, headaches, visual changes, shortness of breath, chest pain, abdominal pain, severe nausea/vomiting, or problems with urination or bowel movements unless otherwise stated above. Pertinent History Reviewed:  Reviewed past medical,surgical, social, obstetrical and family history.  Reviewed problem list, medications and allergies. Physical Assessment:   Vitals:   10/16/19 1504  BP: 124/88  Pulse: 81  Weight: 154 lb (69.9 kg)  Body mass index is 29.1 kg/m.           Physical Examination:   General appearance: alert, well appearing, and in no distress  Mental status: alert, oriented to person, place, and time  Skin: warm & dry   Extremities: Edema: Trace    Cardiovascular: normal heart rate noted  Respiratory: normal respiratory effort, no distress  Abdomen: gravid, soft, non-tender  Pelvic: Cervical exam performed  Dilation: Closed Effacement (%): Thick    Fetal Status: Fetal Heart Rate (bpm): 140 u/s   Movement: Present    Fetal Surveillance Testing today: [place] 36+3 wks,cephalic,posterior placenta gr 2,fhr 140 bpm,AFI 11cm,EFW 3247 g 81%   Chaperone: Amanda Rash    No results found for this or any previous visit (from the past 24 hour(s)).    Assessment & Plan:  1) High-risk pregnancy G1P0 at [redacted]w[redacted]d with an Estimated Date of Delivery: 11/10/19   2) GDMA1, all fastings <90, has had a single PP of 127 and all others <120; had growth scan today with EFW 81st% and AC 96th%   Meds: No orders of the defined types were placed in this encounter.   Labs/procedures today: GC/chlam/GBS and SVE  Treatment Plan:  Weekly visits  Reviewed: Term labor symptoms and general obstetric precautions including but not limited to vaginal bleeding, contractions, leaking of fluid and fetal movement were reviewed in detail with the patient.  All questions were answered. Has home bp cuff. Check bp weekly, let us know if >140/90.   Follow-up: Return in about 1 week (around 10/23/2019) for HROB, in person.  Orders Placed This Encounter  Procedures  . Culture, beta strep (group b only)  . POC Urinalysis Dipstick OB   [name] 10/16/2019 3:34 PM

## 2019-10-18 LAB — CERVICOVAGINAL ANCILLARY ONLY
Chlamydia: NEGATIVE
Comment: NEGATIVE
Comment: NORMAL
Neisseria Gonorrhea: NEGATIVE

## 2019-10-20 LAB — CULTURE, BETA STREP (GROUP B ONLY): Strep Gp B Culture: NEGATIVE

## 2019-10-22 ENCOUNTER — Inpatient Hospital Stay (HOSPITAL_COMMUNITY)
Admission: AD | Admit: 2019-10-22 | Discharge: 2019-10-25 | DRG: 805 | Disposition: A | Discharge status: Home / Self Care | Payer: Medicaid Other | Attending: Obstetrics and Gynecology | Admitting: Obstetrics and Gynecology

## 2019-10-22 ENCOUNTER — Ambulatory Visit (INDEPENDENT_AMBULATORY_CARE_PROVIDER_SITE_OTHER): Payer: Medicaid Other | Admitting: Women's Health

## 2019-10-22 ENCOUNTER — Telehealth: Payer: Self-pay | Admitting: *Deleted

## 2019-10-22 ENCOUNTER — Other Ambulatory Visit: Payer: Self-pay

## 2019-10-22 ENCOUNTER — Encounter (HOSPITAL_COMMUNITY): Payer: Self-pay | Admitting: Family Medicine

## 2019-10-22 VITALS — BP 127/87 | HR 106 | Wt 152.2 lb

## 2019-10-22 DIAGNOSIS — O9852 Other viral diseases complicating childbirth: Secondary | ICD-10-CM | POA: Diagnosis present

## 2019-10-22 DIAGNOSIS — O3443 Maternal care for other abnormalities of cervix, third trimester: Secondary | ICD-10-CM | POA: Diagnosis present

## 2019-10-22 DIAGNOSIS — O134 Gestational [pregnancy-induced] hypertension without significant proteinuria, complicating childbirth: Secondary | ICD-10-CM | POA: Diagnosis present

## 2019-10-22 DIAGNOSIS — Z3A37 37 weeks gestation of pregnancy: Secondary | ICD-10-CM

## 2019-10-22 DIAGNOSIS — O4593 Premature separation of placenta, unspecified, third trimester: Principal | ICD-10-CM | POA: Diagnosis present

## 2019-10-22 DIAGNOSIS — U071 COVID-19: Secondary | ICD-10-CM | POA: Diagnosis present

## 2019-10-22 DIAGNOSIS — O0993 Supervision of high risk pregnancy, unspecified, third trimester: Secondary | ICD-10-CM | POA: Diagnosis not present

## 2019-10-22 DIAGNOSIS — Z331 Pregnant state, incidental: Secondary | ICD-10-CM

## 2019-10-22 DIAGNOSIS — Z1389 Encounter for screening for other disorder: Secondary | ICD-10-CM

## 2019-10-22 DIAGNOSIS — N882 Stricture and stenosis of cervix uteri: Secondary | ICD-10-CM | POA: Diagnosis present

## 2019-10-22 DIAGNOSIS — O4693 Antepartum hemorrhage, unspecified, third trimester: Secondary | ICD-10-CM

## 2019-10-22 DIAGNOSIS — O24420 Gestational diabetes mellitus in childbirth, diet controlled: Secondary | ICD-10-CM | POA: Diagnosis present

## 2019-10-22 DIAGNOSIS — Z8632 Personal history of gestational diabetes: Secondary | ICD-10-CM | POA: Diagnosis present

## 2019-10-22 DIAGNOSIS — O0990 Supervision of high risk pregnancy, unspecified, unspecified trimester: Secondary | ICD-10-CM

## 2019-10-22 DIAGNOSIS — O24410 Gestational diabetes mellitus in pregnancy, diet controlled: Secondary | ICD-10-CM | POA: Diagnosis not present

## 2019-10-22 DIAGNOSIS — R87619 Unspecified abnormal cytological findings in specimens from cervix uteri: Secondary | ICD-10-CM | POA: Diagnosis present

## 2019-10-22 LAB — CBC
HCT: 32.2 % — ABNORMAL LOW (ref 36.0–46.0)
Hemoglobin: 11.5 g/dL — ABNORMAL LOW (ref 12.0–15.0)
MCH: 32.7 pg (ref 26.0–34.0)
MCHC: 35.7 g/dL (ref 30.0–36.0)
MCV: 91.5 fL (ref 80.0–100.0)
Platelets: 297 10*3/uL (ref 150–400)
RBC: 3.52 MIL/uL — ABNORMAL LOW (ref 3.87–5.11)
RDW: 12.8 % (ref 11.5–15.5)
WBC: 12.8 10*3/uL — ABNORMAL HIGH (ref 4.0–10.5)
nRBC: 0 % (ref 0.0–0.2)

## 2019-10-22 LAB — GLUCOSE, CAPILLARY: Glucose-Capillary: 80 mg/dL (ref 70–99)

## 2019-10-22 LAB — TYPE AND SCREEN
ABO/RH(D): O POS
Antibody Screen: NEGATIVE

## 2019-10-22 LAB — ABO/RH: ABO/RH(D): O POS

## 2019-10-22 MED ORDER — SOD CITRATE-CITRIC ACID 500-334 MG/5ML PO SOLN
30.0000 mL | ORAL | Status: DC | PRN
  Administered 2019-10-22: 30 mL via ORAL
  Filled 2019-10-22: qty 30

## 2019-10-22 MED ORDER — LORATADINE 10 MG PO TABS
10.0000 mg | ORAL_TABLET | Freq: Every day | ORAL | Status: DC
  Administered 2019-10-22: 10 mg via ORAL
  Filled 2019-10-22 (×2): qty 1

## 2019-10-22 MED ORDER — FENTANYL CITRATE (PF) 100 MCG/2ML IJ SOLN
100.0000 ug | INTRAMUSCULAR | Status: DC | PRN
  Administered 2019-10-23 (×5): 100 ug via INTRAVENOUS
  Filled 2019-10-22 (×5): qty 2

## 2019-10-22 MED ORDER — LACTATED RINGERS IV SOLN: INTRAVENOUS | Status: DC

## 2019-10-22 MED ORDER — MISOPROSTOL 50MCG HALF TABLET
50.0000 ug | ORAL_TABLET | ORAL | Status: DC | PRN
  Administered 2019-10-22 – 2019-10-23 (×2): 50 ug via ORAL
  Filled 2019-10-22 (×2): qty 1

## 2019-10-22 MED ORDER — ONDANSETRON HCL 4 MG/2ML IJ SOLN
4.0000 mg | Freq: Four times a day (QID) | INTRAMUSCULAR | Status: DC | PRN
  Administered 2019-10-23 (×3): 4 mg via INTRAVENOUS
  Filled 2019-10-22 (×3): qty 2

## 2019-10-22 MED ORDER — OXYCODONE-ACETAMINOPHEN 5-325 MG PO TABS: 1.0000 | ORAL_TABLET | ORAL | Status: DC | PRN

## 2019-10-22 MED ORDER — TERBUTALINE SULFATE 1 MG/ML IJ SOLN: 0.2500 mg | Freq: Once | INTRAMUSCULAR | Status: DC | PRN

## 2019-10-22 MED ORDER — OXYTOCIN 40 UNITS IN NORMAL SALINE INFUSION - SIMPLE MED
2.5000 [IU]/h | INTRAVENOUS | Status: DC
  Filled 2019-10-22: qty 1000

## 2019-10-22 MED ORDER — OXYTOCIN BOLUS FROM INFUSION
500.0000 mL | Freq: Once | INTRAVENOUS | Status: AC
  Administered 2019-10-23: 500 mL via INTRAVENOUS

## 2019-10-22 MED ORDER — OXYCODONE-ACETAMINOPHEN 5-325 MG PO TABS: 2.0000 | ORAL_TABLET | ORAL | Status: DC | PRN

## 2019-10-22 MED ORDER — LIDOCAINE HCL (PF) 1 % IJ SOLN
30.0000 mL | INTRAMUSCULAR | Status: DC | PRN
  Filled 2019-10-22: qty 30

## 2019-10-22 MED ORDER — LACTATED RINGERS IV SOLN: 500.0000 mL | INTRAVENOUS | Status: DC | PRN

## 2019-10-22 MED ORDER — ACETAMINOPHEN 325 MG PO TABS
650.0000 mg | ORAL_TABLET | ORAL | Status: DC | PRN
  Filled 2019-10-22: qty 2

## 2019-10-22 NOTE — H&P (Signed)
Gabriella Wilson is a 24 y.o. G1P0 female at 35w2dby 6wk u/s, presenting for IOL d/t suspected partial marginal abruption.  Had bright red bleeding after sex today that was all on her thighs and was like a period when wiping. She was worked in for a NST which was reactive w/ irregular mild uc's, no active bleeding, but significant lower abd/pelvic pain and tenderness to palpation.  Reports active fetal movement, contractions: ,irregular, vaginal bleeding: none currently, membranes: intact.  Initiated prenatal care at [facility] at 13 wks.   Most recent u/s 5/12 @ 36.3wks, EFW 81% w/ AC 96%, AFI 11cm.   This pregnancy complicated by: AH6PRAbnormal pap-LSIL Persistent RUQ/Rt flank pain w/ normal work-up  Prenatal History/Complications:  primigravida  Past Medical History: Past Medical History:  Diagnosis Date  . Asthma   . Cat allergies    allergic to dogs  . Depression   . Oppositional defiant disorder     Past Surgical History: Past Surgical History:  Procedure Laterality Date  . bilateral tubes in ears     when younger  . CHOLECYSTECTOMY    . KNEE SURGERY Left 2016  . tubes in ears when an infant      Obstetrical History: OB History    Gravida  1   Para      Term      Preterm      AB      Living        SAB      TAB      Ectopic      Multiple      Live Births              Social History: Social History   Socioeconomic History  . Marital status: Single    Spouse name: Not on file  . Number of children: Not on file  . Years of education: Not on file  . Highest education level: Not on file  Occupational History  . Not on file  Tobacco Use  . Smoking status: Never Smoker  . Smokeless tobacco: Never Used  Substance and Sexual Activity  . Alcohol use: No  . Drug use: No  . Sexual activity: Yes    Birth control/protection: None  Other Topics Concern  . Not on file  Social History Narrative  . Not on file   Social Determinants of Health    Financial Resource Strain:   . Difficulty of Paying Living Expenses:   Food Insecurity:   . Worried About RCharity fundraiserin the Last Year:   . RArboriculturistin the Last Year:   Transportation Needs:   . LFilm/video editor(Medical):   .Marland KitchenLack of Transportation (Non-Medical):   Physical Activity:   . Days of Exercise per Week:   . Minutes of Exercise per Session:   Stress:   . Feeling of Stress :   Social Connections:   . Frequency of Communication with Friends and Family:   . Frequency of Social Gatherings with Friends and Family:   . Attends Religious Services:   . Active Member of Clubs or Organizations:   . Attends CArchivistMeetings:   .Marland KitchenMarital Status:     Family History: Family History  Problem Relation Age of Onset  . Depression Mother   . Anxiety disorder Mother   . OCD Mother   . Alcohol abuse Father   . Depression Father   . Drug abuse Father   .  ADD / ADHD Father   . Anxiety disorder Father   . Depression Paternal Aunt   . Depression Maternal Uncle   . Depression Maternal Grandmother   . Anxiety disorder Maternal Grandmother   . Anxiety disorder Maternal Aunt   . Depression Maternal Aunt   . Dementia Paternal Grandmother        PGGM  . Paranoid behavior Neg Hx   . Schizophrenia Neg Hx   . Seizures Neg Hx   . Sexual abuse Neg Hx   . Physical abuse Neg Hx     Allergies: Allergies  Allergen Reactions  . Peanuts [Peanut Oil] Anaphylaxis  . Amoxicillin-Pot Clavulanate   . Biaxin [Clarithromycin] Nausea Only  . Erythromycin Nausea Only  . Other Nausea Only    Codeine  . Sulfa Antibiotics Nausea Only    Medications Prior to Admission  Medication Sig Dispense Refill Last Dose  . Accu-Chek Softclix Lancets lancets Use as instructed to check blood sugar 4 times daily 100 each 12 10/22/2019 at Unknown time  . Blood Glucose Monitoring Suppl (ACCU-CHEK GUIDE ME) w/Device KIT 1 each by Does not apply route 4 (four) times daily. 1 kit  0 10/22/2019 at Unknown time  . glucose blood (ACCU-CHEK GUIDE) test strip Use as instructed to check blood sugar 4 times daily 50 each 12 10/22/2019 at Unknown time  . loratadine (CLARITIN) 10 MG tablet Take 10 mg by mouth daily.   10/22/2019 at Unknown time  . Multiple Vitamin (MULTIVITAMIN ADULT PO) Take by mouth.   10/22/2019 at Unknown time  . cyclobenzaprine (FLEXERIL) 5 MG tablet Take 1 tablet (5 mg total) by mouth every 8 (eight) hours as needed for muscle spasms. 20 tablet 0 More than a month at Unknown time  . traMADol (ULTRAM) 50 MG tablet Take 1 tablet (50 mg total) by mouth every 6 (six) hours as needed. 10 tablet 0 More than a month at Unknown time    Review of Systems  Pertinent pos/neg as indicated in HPI  Blood pressure 103/64, pulse (!) 117, temperature 98.2 F (36.8 C), temperature source Oral, resp. rate 17, height 5' 1.5" (1.562 m), weight 69 kg, last menstrual period 01/26/2019, SpO2 99 %. General appearance: alert, cooperative and no distress Lungs: clear to auscultation bilaterally Heart: regular rate and rhythm Abdomen: gravid, soft, non-tender Extremities: tr edema DTR's 2+  Fetal monitoring: FHR: 150 bpm, variability: moderate,  Accelerations: Present,  decelerations:  Absent Uterine activity: q 2-80mns Dilation: Fingertip Effacement (%): 80 Station: -2 Exam by:: K. Evelin Cake,CNM Presentation: cephalic  No active bleeding   Prenatal labs: ABO, Rh: --/--/PENDING (05/18 2011) Antibody: PENDING (05/18 2011) Rubella: 7.65 (11/30 1555) RPR: Non Reactive (03/02 0907)  HBsAg: Negative (11/30 1555)  HIV: Non Reactive (03/02 0907)  GBS: Negative/-- (05/12 1637)  2hr GTT: 77/123/155  Results for orders placed or performed during the hospital encounter of 10/22/19 (from the past 24 hour(s))  Type and screen [place]  Collection Time: 10/22/19  8:11 PM  Result Value Ref Range   ABO/RH(D) PENDING    Antibody Screen PENDING    Sample  Expiration      10/25/2019,2359 Performed at [facility] [address] [place] [address]  CBC   Collection Time: 10/22/19  8:12 PM  Result Value Ref Range   WBC 12.8 (H) 4.0 - 10.5 K/uL   RBC 3.52 (L) 3.87 - 5.11 MIL/uL   Hemoglobin 11.5 (L) 12.0 - 15.0 g/dL  HCT 32.2 (L) 36.0 - 46.0 %   MCV 91.5 80.0 - 100.0 fL   MCH 32.7 26.0 - 34.0 pg   MCHC 35.7 30.0 - 36.0 g/dL   RDW 12.8 11.5 - 15.5 %   Platelets 297 150 - 400 K/uL   nRBC 0.0 0.0 - 0.2 %     Assessment:  33w2dSIUP  G1P0  IOL for suspected partial marginal abruption  Cat 1 FHR  A1DM-EFW 81%  GBS Negative/-- (05/12 1637)  Plan:  Admit to L&D  IV pain meds/epidural prn active labor  cytotec then foley bulb when able  Anticipate NSVB   Plans to bottlefeed  Contraception: IUD outpatient  Circumcision: n/a  KRoma SchanzCNM, WHNP-BC 10/22/2019, 9:10 PM

## 2019-10-22 NOTE — Telephone Encounter (Signed)
Patient states she had intercourse about 2 hours ago and has had a "gush" of blood immediately afterwards.  She is currently not bleeding and baby is active.  Is feeling some lower abdominal discomfort but not anything unbearable.  Discussed with KRB and will have patient come in for NST. Pt agreeable to plan and scheduled to come this afternoon.

## 2019-10-22 NOTE — Progress Notes (Signed)
   Work-in HIGH-RISK PREGNANCY VISIT Patient name: Gabriella Wilson MRN [id number]  Date of birth: [birth year] Chief Complaint:   No chief complaint on file.  History of Present Illness:   Gabriella Wilson is a 24 y.o. G1P0 female at [redacted]w[redacted]d with an Estimated Date of Delivery: 11/10/19 being seen today for ongoing management of a high-risk pregnancy complicated by diabetes mellitus A1DM.  Today she is being seen as a work-in for report of heavy bright red vaginal bleeding after sex today. Was on thighs, was like a period when she wiped, slowed to spotting, and just stopped. This is the first time she's had any bleeding. Has had intermittent low back and low abd cramping since sex, with a constant lower abd pain that is tender to touch. +fm. Denies LOF. Denies abnormal discharge, itching/odor/irritation.   Reports CBGs all normal Depression screen PHQ 2/9 05/06/2019  Decreased Interest 0  Down, Depressed, Hopeless 1  PHQ - 2 Score 1     .  .   . denies leaking of fluid.  Review of Systems:   Pertinent items are noted in HPI Denies abnormal vaginal discharge w/ itching/odor/irritation, headaches, visual changes, shortness of breath, chest pain, abdominal pain, severe nausea/vomiting, or problems with urination or bowel movements unless otherwise stated above. Pertinent History Reviewed:  Reviewed past medical,surgical, social, obstetrical and family history.  Reviewed problem list, medications and allergies. Physical Assessment:  There were no vitals filed for this visit.There is no height or weight on file to calculate BMI.           Physical Examination:   General appearance: alert, well appearing, and in no distress  Mental status: alert, oriented to person, place, and time  Skin: warm & dry   Extremities:      Cardiovascular: normal heart rate noted  Respiratory: normal respiratory effort, no distress  Abdomen: gravid, soft, significant tenderness low abd/pelvis  Pelvic: Spec exam: cx visually  closed, no active bleeding, small amt light pink discharge on qtip         Fetal Status:         NST: FHR baseline 155 bpm, Variability: min-mod, Accelerations:present, Decelerations:  Absent= Cat 1/Reactive Toco: q 2-32mins  Chaperone: Malachy Mood    No results found for this or any previous visit (from the past 24 hour(s)).  Assessment & Plan:  1) High-risk pregnancy G1P0 at [redacted]w[redacted]d with an Estimated Date of Delivery: 11/10/19   2) A1DM, stable, EFW 81% last week  3) Suspected partial marginal abruption, Reactive NST, no active bleeding, discussed w/ LHE, recommends IOL. Notified Dr. Adrian Blackwater and L&D charge. Pt to go to [facility] asap  Meds: No orders of the defined types were placed in this encounter.  Labs/procedures today: nst, spec exam  Follow-up: Return for will schedule ppv after delivery.  No orders of the defined types were placed in this encounter.  Cheral Marker CNM, [facility] 10/22/2019 4:49 PM

## 2019-10-22 NOTE — Progress Notes (Signed)
Labor Progress Note Gabriella Wilson is a 24 y.o. G1P0 at [redacted]w[redacted]d presented for IOL due to suspected partial marginal abruption.  S: Patient reports feeling contractions and is "getting close" to wanting pain medication.  O:  BP 127/84 (BP Location: Right Arm)   Pulse (!) 117   Temp 98.2 F (36.8 C) (Oral)   Resp 16   Ht 5' 1.5" (1.562 m)   Wt 69 kg   LMP 01/26/2019 (Approximate)   SpO2 99%   BMI 28.29 kg/m  EFM: 150/moderate variability/1 early decel  CVE: Dilation: Fingertip Effacement (%): 80 Cervical Position: Middle Station: -2 Exam by:: K. Booker,CNM   A&P: 24 y.o. G1P0 [redacted]w[redacted]d here for IOL d/t suspected partial marginal abruption. #Labor: Progressing well. Plan for cervical exam in 1 hr when next dose of cytotec is due to determine if able to insert foley bulb catheter. #Pain: tylenol prn, fentanyl prn, epidural if desired #FWB: category I #GBS negative #GDMA1 - check CBGs q4h (chronic/other problems)  Solmon Ice Rilda Bulls, DO 11:12 PM

## 2019-10-23 ENCOUNTER — Encounter (HOSPITAL_COMMUNITY): Payer: Self-pay | Admitting: Family Medicine

## 2019-10-23 ENCOUNTER — Inpatient Hospital Stay (HOSPITAL_COMMUNITY): Payer: Medicaid Other | Admitting: Anesthesiology

## 2019-10-23 DIAGNOSIS — O4693 Antepartum hemorrhage, unspecified, third trimester: Secondary | ICD-10-CM

## 2019-10-23 DIAGNOSIS — Z3A37 37 weeks gestation of pregnancy: Secondary | ICD-10-CM

## 2019-10-23 LAB — GLUCOSE, CAPILLARY
Glucose-Capillary: 111 mg/dL — ABNORMAL HIGH (ref 70–99)
Glucose-Capillary: 126 mg/dL — ABNORMAL HIGH (ref 70–99)
Glucose-Capillary: 86 mg/dL (ref 70–99)
Glucose-Capillary: 93 mg/dL (ref 70–99)

## 2019-10-23 LAB — COMPREHENSIVE METABOLIC PANEL
ALT: 13 U/L (ref 0–44)
AST: 21 U/L (ref 15–41)
Albumin: 2.7 g/dL — ABNORMAL LOW (ref 3.5–5.0)
Alkaline Phosphatase: 111 U/L (ref 38–126)
Anion gap: 10 (ref 5–15)
BUN: 8 mg/dL (ref 6–20)
CO2: 21 mmol/L — ABNORMAL LOW (ref 22–32)
Calcium: 8.6 mg/dL — ABNORMAL LOW (ref 8.9–10.3)
Chloride: 103 mmol/L (ref 98–111)
Creatinine, Ser: 0.4 mg/dL — ABNORMAL LOW (ref 0.44–1.00)
GFR calc Af Amer: >60 mL/min (ref >60)
GFR calc non Af Amer: >60 mL/min (ref >60)
Glucose, Bld: 95 mg/dL (ref 70–99)
Potassium: 3.6 mmol/L (ref 3.5–5.1)
Sodium: 134 mmol/L — ABNORMAL LOW (ref 135–145)
Total Bilirubin: 0.8 mg/dL (ref 0.3–1.2)
Total Protein: 6.5 g/dL (ref 6.5–8.1)

## 2019-10-23 LAB — PROTEIN / CREATININE RATIO, URINE
Creatinine, Urine: 75.46 mg/dL
Protein Creatinine Ratio: 0.23 mg/mg{Cre} — ABNORMAL HIGH (ref 0.00–0.15)
Total Protein, Urine: 17 mg/dL

## 2019-10-23 LAB — SARS CORONAVIRUS 2 (TAT 6-24 HRS): SARS Coronavirus 2: POSITIVE — AB

## 2019-10-23 LAB — RPR: RPR Ser Ql: NONREACTIVE

## 2019-10-23 MED ORDER — TERBUTALINE SULFATE 1 MG/ML IJ SOLN: 0.2500 mg | Freq: Once | INTRAMUSCULAR | Status: DC | PRN

## 2019-10-23 MED ORDER — EPHEDRINE 5 MG/ML INJ: 10.0000 mg | INTRAVENOUS | Status: DC | PRN

## 2019-10-23 MED ORDER — DIBUCAINE (PERIANAL) 1 % EX OINT: 1.0000 "application " | TOPICAL_OINTMENT | CUTANEOUS | Status: DC | PRN

## 2019-10-23 MED ORDER — ONDANSETRON HCL 4 MG/2ML IJ SOLN: 4.0000 mg | INTRAMUSCULAR | Status: DC | PRN

## 2019-10-23 MED ORDER — WITCH HAZEL-GLYCERIN EX PADS: 1.0000 "application " | MEDICATED_PAD | CUTANEOUS | Status: DC | PRN

## 2019-10-23 MED ORDER — FENTANYL-BUPIVACAINE-NACL 0.5-0.125-0.9 MG/250ML-% EP SOLN
12.0000 mL/h | EPIDURAL | Status: DC | PRN
  Filled 2019-10-23: qty 250

## 2019-10-23 MED ORDER — TETANUS-DIPHTH-ACELL PERTUSSIS 5-2.5-18.5 LF-MCG/0.5 IM SUSP: 0.5000 mL | Freq: Once | INTRAMUSCULAR | Status: DC

## 2019-10-23 MED ORDER — PRENATAL MULTIVITAMIN CH
1.0000 | ORAL_TABLET | Freq: Every day | ORAL | Status: DC
  Administered 2019-10-24: 1 via ORAL
  Filled 2019-10-23: qty 1

## 2019-10-23 MED ORDER — LIDOCAINE HCL (PF) 1 % IJ SOLN
INTRAMUSCULAR | Status: DC | PRN
  Administered 2019-10-23 (×2): 4 mL via EPIDURAL

## 2019-10-23 MED ORDER — SIMETHICONE 80 MG PO CHEW: 80.0000 mg | CHEWABLE_TABLET | ORAL | Status: DC | PRN

## 2019-10-23 MED ORDER — ALBUTEROL SULFATE HFA 108 (90 BASE) MCG/ACT IN AERS
1.0000 | INHALATION_SPRAY | RESPIRATORY_TRACT | Status: DC | PRN
  Filled 2019-10-23: qty 6.7

## 2019-10-23 MED ORDER — PHENYLEPHRINE 40 MCG/ML (10ML) SYRINGE FOR IV PUSH (FOR BLOOD PRESSURE SUPPORT): 80.0000 ug | PREFILLED_SYRINGE | INTRAVENOUS | Status: DC | PRN

## 2019-10-23 MED ORDER — COCONUT OIL OIL: 1.0000 "application " | TOPICAL_OIL | Status: DC | PRN

## 2019-10-23 MED ORDER — SENNOSIDES-DOCUSATE SODIUM 8.6-50 MG PO TABS
2.0000 | ORAL_TABLET | ORAL | Status: DC
  Administered 2019-10-23 – 2019-10-24 (×2): 2 via ORAL
  Filled 2019-10-23 (×2): qty 2

## 2019-10-23 MED ORDER — LACTATED RINGERS IV SOLN
500.0000 mL | Freq: Once | INTRAVENOUS | Status: AC
  Administered 2019-10-23: 500 mL via INTRAVENOUS

## 2019-10-23 MED ORDER — IBUPROFEN 600 MG PO TABS
600.0000 mg | ORAL_TABLET | Freq: Three times a day (TID) | ORAL | Status: DC | PRN
  Administered 2019-10-23 – 2019-10-25 (×4): 600 mg via ORAL
  Filled 2019-10-23 (×4): qty 1

## 2019-10-23 MED ORDER — DIPHENHYDRAMINE HCL 50 MG/ML IJ SOLN: 12.5000 mg | INTRAMUSCULAR | Status: DC | PRN

## 2019-10-23 MED ORDER — LACTATED RINGERS IV SOLN: 500.0000 mL | Freq: Once | INTRAVENOUS | Status: DC

## 2019-10-23 MED ORDER — ONDANSETRON HCL 4 MG PO TABS
4.0000 mg | ORAL_TABLET | ORAL | Status: DC | PRN
  Administered 2019-10-24 – 2019-10-25 (×4): 4 mg via ORAL
  Filled 2019-10-23 (×4): qty 1

## 2019-10-23 MED ORDER — AMMONIA AROMATIC IN INHA
RESPIRATORY_TRACT | Status: AC
  Filled 2019-10-23: qty 10

## 2019-10-23 MED ORDER — OXYTOCIN 40 UNITS IN NORMAL SALINE INFUSION - SIMPLE MED
1.0000 m[IU]/min | INTRAVENOUS | Status: DC
  Administered 2019-10-23: 2 m[IU]/min via INTRAVENOUS

## 2019-10-23 MED ORDER — BENZOCAINE-MENTHOL 20-0.5 % EX AERO: 1.0000 "application " | INHALATION_SPRAY | CUTANEOUS | Status: DC | PRN

## 2019-10-23 MED ORDER — DIPHENHYDRAMINE HCL 25 MG PO CAPS: 25.0000 mg | ORAL_CAPSULE | Freq: Four times a day (QID) | ORAL | Status: DC | PRN

## 2019-10-23 MED ORDER — ACETAMINOPHEN 325 MG PO TABS
650.0000 mg | ORAL_TABLET | Freq: Four times a day (QID) | ORAL | Status: DC | PRN
  Administered 2019-10-24 – 2019-10-25 (×5): 650 mg via ORAL
  Filled 2019-10-23 (×5): qty 2

## 2019-10-23 MED ORDER — MEASLES, MUMPS & RUBELLA VAC IJ SOLR: 0.5000 mL | Freq: Once | INTRAMUSCULAR | Status: DC

## 2019-10-23 MED ORDER — SODIUM CHLORIDE (PF) 0.9 % IJ SOLN
INTRAMUSCULAR | Status: DC | PRN
  Administered 2019-10-23: 12 mL/h via EPIDURAL

## 2019-10-23 NOTE — Discharge Summary (Signed)
Postpartum Discharge Summary    Patient Name: Gabriella Wilson DOB: 11/22/1995 MRN: [id number]  Date of admission: 10/22/2019 Delivery date:10/23/2019  Delivering provider: Chauncey Mann  Date of discharge: 10/25/2019  Admitting diagnosis: Normal labor and delivery [O80] Intrauterine pregnancy: [redacted]w[redacted]d    Secondary diagnosis:  Active Problems:   Cervical stenosis (uterine cervix)   Supervision of high risk pregnancy, antepartum   Gestational diabetes mellitus, class A1   Abnormal Pap smear of cervix   Normal labor and delivery   Gestational hypertension   Vaginal bleeding in pregnancy, third trimester   COVID-19  Additional problems: None    Discharge diagnosis: Term Pregnancy Delivered, Gestational Hypertension and GDM A1                                              Post partum procedures:None Augmentation: AROM, Pitocin, Cytotec and IP Foley Complications: None  Hospital course: Induction of Labor With Vaginal Delivery   24y.o. yo G1P0 at 32w3das admitted to the hospital 10/22/2019 for induction of labor.  Indication for induction: moderate vaginal bleeding.  Patient had an uncomplicated labor course as follows: Initial SVE: 1/80/-2. Patient received Cytotec, Foley balloon, Pitocin and AROM. Received epidural. She then progressed to complete. Membrane Rupture Time/Date: 11:20 AM ,10/23/2019   Delivery Method:Vaginal, Spontaneous  Episiotomy: None  Lacerations:  Periurethral  Details of delivery can be found in separate delivery note. Fasting AM glucose97. BP's monitored and are now normal> Patient had a routine postpartum course. Patient is discharged home 10/25/19.  Newborn Data: Birth date:10/23/2019  Birth time:7:37 PM  Gender:Female  Living status:Living  Apgars:9 ,9  Weight:3245 g   Magnesium Sulfate received: No BMZ received: No Rhophylac:No MMR:No T-DaP:Given prenatally Flu: No Transfusion:No  Physical exam  Vitals:   10/24/19 0305 10/24/19 0956 10/24/19 2028  10/25/19 0525  BP: 105/78 113/76 109/79 107/67  Pulse: 97 98 99 80  Resp: 18 17 16 16   Temp: 98.3 F (36.8 C) 98.1 F (36.7 C) 99.4 F (37.4 C) 97.9 F (36.6 C)  TempSrc: Oral  Oral Oral  SpO2: 98% 98% 98% 98%  Weight:      Height:       General: alert, cooperative and no distress Lochia: appropriate Uterine Fundus: firm Incision: N/A DVT Evaluation: No evidence of DVT seen on physical exam. No significant calf/ankle edema. Labs: Lab Results  Component Value Date   WBC 12.8 (H) 10/22/2019   HGB 11.5 (L) 10/22/2019   HCT 32.2 (L) 10/22/2019   MCV 91.5 10/22/2019   PLT 297 10/22/2019   CMP Latest Ref Rng & Units 10/23/2019  Glucose 70 - 99 mg/dL 95  BUN 6 - 20 mg/dL 8  Creatinine 0.44 - 1.00 mg/dL 0.40(L)  Sodium 135 - 145 mmol/L 134(L)  Potassium 3.5 - 5.1 mmol/L 3.6  Chloride 98 - 111 mmol/L 103  CO2 22 - 32 mmol/L 21(L)  Calcium 8.9 - 10.3 mg/dL 8.6(L)  Total Protein 6.5 - 8.1 g/dL 6.5  Total Bilirubin 0.3 - 1.2 mg/dL 0.8  Alkaline Phos 38 - 126 U/L 111  AST 15 - 41 U/L 21  ALT 0 - 44 U/L 13   Edinburgh Score: Edinburgh Postnatal Depression Scale Screening Tool 10/23/2019  I have been able to laugh and see the funny side of things. 0  I have looked forward with enjoyment to things.  0  I have blamed myself unnecessarily when things went wrong. 1  I have been anxious or worried for no good reason. 0  I have felt scared or panicky for no good reason. 0  Things have been getting on top of me. 0  I have been so unhappy that I have had difficulty sleeping. 0  I have felt sad or miserable. 0  I have been so unhappy that I have been crying. 0  The thought of harming myself has occurred to me. 0  Edinburgh Postnatal Depression Scale Total 1     After visit meds:  Allergies as of 10/25/2019      Reactions   Peanuts [peanut Oil] Anaphylaxis   Amoxicillin-pot Clavulanate    Biaxin [clarithromycin] Nausea Only   Erythromycin Nausea Only   Other Nausea Only    Codeine   Sulfa Antibiotics Nausea Only      Medication List    STOP taking these medications   Accu-Chek Guide Me w/Device Kit   Accu-Chek Guide test strip Generic drug: glucose blood   Accu-Chek Softclix Lancets lancets   cyclobenzaprine 5 MG tablet Commonly known as: FLEXERIL   traMADol 50 MG tablet Commonly known as: ULTRAM     TAKE these medications   acetaminophen 325 MG tablet Commonly known as: Tylenol Take 2 tablets (650 mg total) by mouth every 6 (six) hours as needed (for pain scale < 4).   calcium carbonate 500 MG chewable tablet Commonly known as: TUMS - dosed in mg elemental calcium Chew 1-2 tablets by mouth as needed for indigestion or heartburn.   ibuprofen 600 MG tablet Commonly known as: ADVIL Take 1 tablet (600 mg total) by mouth every 8 (eight) hours as needed for mild pain.   loratadine 10 MG tablet Commonly known as: CLARITIN Take 10 mg by mouth daily.   MULTIVITAMIN ADULT PO Take by mouth.   ondansetron 8 MG disintegrating tablet Commonly known as: Zofran ODT Take 1 tablet (8 mg total) by mouth every 8 (eight) hours as needed for nausea or vomiting.   polyethylene glycol powder 17 GM/SCOOP powder Commonly known as: GLYCOLAX/MIRALAX Take 17 g by mouth daily as needed.        Discharge home in stable condition Infant Feeding: Bottle Infant Disposition:home with mother Discharge instruction: per After Visit Summary and Postpartum booklet. Activity: Advance as tolerated. Pelvic rest for 6 weeks.  Diet: routine diet Future Appointments: Future Appointments  Date Time Provider [place]  10/31/2019 11:10 AM [facility] NURSE [facility]  11/28/2019  1:30 PM Roma Schanz, CNM [facility]   Follow up Visit: Follow-up Information    [facility] Follow up on 10/31/2019.   Specialty: Obstetrics and Gynecology Why: for a mychart video visit to check on your blood pressure Contact information: [address] [place] [phone]           Please schedule this patient for a In person postpartum visit in 4 weeks with the following provider: Any provider. Additional Postpartum F/U:2 hour GTT and IUD insertion in 4-6 weeks, BP check 1 week  High risk pregnancy complicated by: GDM and HTN Delivery mode:  Vaginal, Spontaneous  Anticipated Birth Control:  IUD to be inserted post-partum   10/25/2019 Clarnce Flock, MD

## 2019-10-23 NOTE — Progress Notes (Signed)
Labor Progress Note Gabriella Wilson is a 24 y.o. G1P0 at 63w3dpresented for  IOL due to suspected partial marginal abruption.  S: Comfortable with epidural. Does report some wheezing. Met patient and discussed plan.   O:  BP 111/77   Pulse 86   Temp 98 F (36.7 C) (Oral)   Resp 16   Ht 5' 1"  (1.549 m)   Wt 69.4 kg   LMP 01/26/2019 (Approximate)   SpO2 94%   BMI 28.91 kg/m  EFM: 145, minimal to moderate variability, no accels, no decels TOCO: q279mCVE: Dilation: 5.5 Effacement (%): 70 Cervical Position: Middle Station: -2 Presentation: Vertex Exam by:: Aletha Allebach   A&P: [age].o. G1P0 3762w3dere for IOL d/t suspected partial marginal abruption. #Labor: Progressing well. S/p FB and Cyto x2. Pit started at 0912. AROM with moderate amount of clear fluid at this exam. Anticipate SVD. Admitted for possible abruption. Minimal spotting on towel and no bleeding with exam.  #Pain: epidural  #FWB: Cat II; reassuring for occasional moderate variability, positive fetal scalp stim #GBS negative #GDMA1: last glucose 126; EFW 3200g #COVD: Some wheezing and a cough which patient attributes to asthma, otherwise asymptomatic or room air; Albuterol PRN ordered #gHTN: noted to have elevated BP's; Pre-E labs negative. Normal range currently.   CheChauncey MannD 11:35 AM

## 2019-10-23 NOTE — Anesthesia Procedure Notes (Signed)
Epidural Patient location during procedure: OB Start time: 10/23/2019 8:03 AM  Staffing Anesthesiologist: Mal Amabile, MD Performed: anesthesiologist   Preanesthetic Checklist Completed: patient identified, IV checked, site marked, risks and benefits discussed, surgical consent, monitors and equipment checked, pre-op evaluation and timeout performed  Epidural Patient position: sitting Prep: DuraPrep and site prepped and draped Patient monitoring: continuous pulse ox and blood pressure Approach: midline Location: L4-L5 Injection technique: LOR air  Needle:  Needle type: Tuohy  Needle gauge: 17 G Needle length: 9 cm and 9 Needle insertion depth: 7 cm Catheter type: closed end flexible Catheter size: 19 Gauge Catheter at skin depth: 12 cm Test dose: negative and Other  Assessment Events: blood not aspirated, injection not painful, no injection resistance, no paresthesia and negative IV test  Additional Notes Patient identified. Risks and benefits discussed including failed block, incomplete  Pain control, post dural puncture headache, nerve damage, paralysis, blood pressure Changes, nausea, vomiting, reactions to medications-both toxic and allergic and post Partum back pain. All questions were answered. Patient expressed understanding and wished to proceed. Sterile technique was used throughout procedure. Epidural site was Dressed with sterile barrier dressing. No paresthesias, signs of intravascular injection Or signs of intrathecal spread were encountered.  Patient was more comfortable after the epidural was dosed. Please see RN's note for documentation of vital signs and FHR which are stable. Reason for block:procedure for pain

## 2019-10-23 NOTE — Progress Notes (Addendum)
Labor Progress Note TORRANCE FRECH is a 24 y.o. G1P0 at [redacted]w[redacted]d presented for  IOL due to suspected partial marginal abruption.  S: Comfortable with epidural.  O:  BP 106/62   Pulse (!) 102   Temp 98.3 F (36.8 C) (Oral)   Resp 16   Ht 5\' 1"  (1.549 m)   Wt 69.4 kg   LMP 01/26/2019 (Approximate)   SpO2 94%   BMI 28.91 kg/m  EFM: 145, minimal to moderate variability, pos accels, no decels TOCO: q76m  CVE: Dilation: 8 Effacement (%): 100 Cervical Position: Middle Station: 0 Presentation: Vertex Exam by:: lee   A&P: 24 y.o. G1P0 [redacted]w[redacted]d  here for IOL d/t suspected partial marginal abruption. #Labor: Progressing well. S/p FB and Cyto x2 and AROM. Cont Pit. Anticipate SVD.   #Pain: epidural  #FWB: Cat I #GBS negative #GDMA1: last glucose 86 #COVD: Some wheezing and a cough which patient attributes to asthma, otherwise asymptomatic or room air; Albuterol PRN ordered #gHTN: noted to have elevated BP's; Pre-E labs negative. Normal range currently.   [redacted]w[redacted]d, MD 4:19 PM

## 2019-10-23 NOTE — Progress Notes (Signed)
Labor Progress Note Gabriella Wilson is a 24 y.o. G1P0 at [redacted]w[redacted]d presented for  IOL due to suspected partial marginal abruption.  S: Feeling stronger contractions, improves with fentanyl.  Foley bulb fell out.  Thinks she felt a gush of fluid after she was checked by nurse.  O:  BP 113/67   Pulse (!) 104   Temp 98 F (36.7 C) (Oral)   Resp 16   Ht 5' 1.5" (1.562 m)   Wt 69 kg   LMP 01/26/2019 (Approximate)   SpO2 97%   BMI 28.29 kg/m  EFM: 140/moderate variability/+ accels, no decels  CVE: Dilation: 4.5 Effacement (%): 70 Cervical Position: Middle Station: -2 Presentation: Vertex Exam by:: cwhite,rnc   A&P: 24 y.o. G1P0 [redacted]w[redacted]d  here for IOL d/t suspected partial marginal abruption. #Labor: Progressing well. Foley bulb now out.  Would like to avoid pitocin, but plans to re-assess her desire in 1 hr.  She would like to hold off on epidural at this time as well and would like to be >5cm before she receives this.  After epidural, she states that she may be agreeable to pitocin. #Pain: fentanyl prn, epidural if desired #FWB: category I #GBS negative #GDMA1 - check CBGs q4h  Solmon Ice Alajiah Dutkiewicz, DO 4:15 AM

## 2019-10-23 NOTE — Anesthesia Preprocedure Evaluation (Signed)
Anesthesia Evaluation  Patient identified by MRN, date of birth, ID band Patient awake    Reviewed: Allergy & Precautions, H&P , NPO status , Patient's Chart, lab work & pertinent test results  Airway Mallampati: II   Neck ROM: full    Dental   Pulmonary asthma ,    breath sounds clear to auscultation       Cardiovascular negative cardio ROS   Rhythm:regular Rate:Normal     Neuro/Psych PSYCHIATRIC DISORDERS Depression    GI/Hepatic   Endo/Other    Renal/GU      Musculoskeletal   Abdominal   Peds  Hematology   Anesthesia Other Findings   Reproductive/Obstetrics (+) Pregnancy                             Anesthesia Physical Anesthesia Plan  ASA: II  Anesthesia Plan: Epidural   Post-op Pain Management:    Induction: Intravenous  PONV Risk Score and Plan: 2 and Treatment may vary due to age or medical condition  Airway Management Planned: Natural Airway  Additional Equipment:   Intra-op Plan:   Post-operative Plan:   Informed Consent: I have reviewed the patients History and Physical, chart, labs and discussed the procedure including the risks, benefits and alternatives for the proposed anesthesia with the patient or authorized representative who has indicated his/her understanding and acceptance.       Plan Discussed with: Anesthesiologist  Anesthesia Plan Comments:         Anesthesia Quick Evaluation

## 2019-10-23 NOTE — Progress Notes (Signed)
Labor Progress Note Gabriella Wilson is a 24 y.o. G1P0 at [redacted]w[redacted]d presented for  IOL due to suspected partial marginal abruption.  S: Increasing intensity of contractions.  Asking for fentanyl.  Would like to proceed with augmentation and foley bulb insertion.  O:  BP 133/83 (BP Location: Left Arm)   Pulse (!) 114   Temp 98.2 F (36.8 C) (Oral)   Resp 18   Ht 5' 1.5" (1.562 m)   Wt 69 kg   LMP 01/26/2019 (Approximate)   SpO2 99%   BMI 28.29 kg/m  EFM: 145/moderate variability/+ accels, no decels  CVE: Dilation: 1 Effacement (%): 80 Cervical Position: Middle Station: -2 Presentation: Vertex Exam by:: cwhite,rnc   A&P: 24 y.o. G1P0 [redacted]w[redacted]d  here for IOL d/t suspected partial marginal abruption. #Labor: Progressing well. Foley bulb inserted by [name], CNM.  Patient tolerated procedure well.  Will proceed with another dose of cytotec. #Pain: fentanyl prn, epidural if desired #FWB: category I #GBS negative #GDMA1 - check CBGs q4h  Solmon Ice Meccariello, DO 12:46 AM

## 2019-10-24 ENCOUNTER — Encounter: Payer: Medicaid Other | Admitting: Obstetrics & Gynecology

## 2019-10-24 DIAGNOSIS — U071 COVID-19: Secondary | ICD-10-CM | POA: Diagnosis present

## 2019-10-24 LAB — GLUCOSE, CAPILLARY: Glucose-Capillary: 97 mg/dL (ref 70–99)

## 2019-10-24 MED ORDER — LORATADINE 10 MG PO TABS
10.0000 mg | ORAL_TABLET | Freq: Every day | ORAL | Status: DC
  Administered 2019-10-24 – 2019-10-25 (×2): 10 mg via ORAL
  Filled 2019-10-24 (×2): qty 1

## 2019-10-24 NOTE — Progress Notes (Addendum)
Post Partum Day 1 s/p VD, admitted for IOL for vaginal bleeding.  Subjective:  Gabriella Wilson is a 24 y.o. G1P1001 [redacted]w[redacted]d s/p VD admitted for IOL for vaginal bleeding, pregnancy complicated by GDMA1, gHTN.  No acute events overnight.  Pt denies problems with ambulating, voiding or po intake.  She reports improving nausea, denies vomiting.  Pain is moderately controlled.  She has not had flatus. She has not had bowel movement.  Lochia Moderate.  Plan for birth control is IUD outpatient.  Method of Feeding: bottle Objective: BP 105/78 (BP Location: Right Arm)   Pulse 97   Temp 98.3 F (36.8 C) (Oral)   Resp 18   Ht 5\' 1"  (1.549 m)   Wt 69.4 kg   LMP 01/26/2019 (Approximate)   SpO2 98%   Breastfeeding Unknown   BMI 28.91 kg/m   Physical Exam:  General: alert, cooperative and no distress Lochia:normal flow Chest: CTAB Heart: RRR no m/r/g Abdomen: +BS, soft, nontender, fundus firm at/below umbilicus Uterine Fundus: firm DVT Evaluation: No evidence of DVT seen on physical exam. Extremities: No edema  Recent Labs    10/22/19 2012  HGB 11.5*  HCT 32.2*    Assessment/Plan:  ASSESSMENT: Gabriella Wilson is a 24 y.o. G1P1001 [redacted]w[redacted]d ppd #1 s/p VD after IOL for vaginal bleeding doing well.   - discharge home tomorrow - contraception: outpatient IUD - GDMA1: CBG this AM 97 - gHTN: noted to have elevated BPs during labor, BP now improved  - LSIL pap: repeat outpatient March 2022    LOS: 2 days   April 2022 10/24/2019, 4:53 AM   GME ATTESTATION:  I saw and evaluated the patient. I agree with the findings and the plan of care as documented in the resident's note.  10/26/2019, DO OB Fellow, Faculty [facility], Center for [facility] 10/24/2019 7:21 AM

## 2019-10-24 NOTE — Progress Notes (Signed)
CSW received consult for hx of Anxiety and Depression.  CSW met with MOB to offer support and complete assessment.    CSW called into room to speak with MOB as CSW aware that MOB is on contact precaution. CSW introduced role to MOB via phone and confirmed with MOB that she was okay with speak with CSW via phone, MOB agreeable. CSW advised MOB of CSW's role and the reason for CSW calling to speak with her. MOB reported that she was diagnosed  with depression and anxiety around the age of 58. MOB reported that she was placed on medications and in therapy during that time. MOB reported that she is not on any meds currently or in therapy and reported no desire for resources at this time. CSW understanding of this. MOB reported no other mental health hx and reported that she isn't feeling SI or HI and denies DV. MOB reported that she had all needed items to care for infant and reported her supports are FOB and her family.   CSW provided education regarding the baby blues period vs. perinatal mood disorders, discussed treatment and gave resources for mental health follow up if concerns arise.  CSW recommends self-evaluation during the postpartum time period using the New Mom Checklist from Postpartum Progress and encouraged MOB to contact a medical professional if symptoms are noted at any time.   CSW provided review of Sudden Infant Death Syndrome (SIDS) precautions.  MOB reported that she has been feeling fine since giving birth.  CSW identifies no further need for intervention and no barriers to discharge at this time.    Virgie Dad Noell Lorensen, MSW, LCSW [facility] [phone]

## 2019-10-24 NOTE — Anesthesia Postprocedure Evaluation (Signed)
Anesthesia Post Note  Patient: Gabriella Wilson  Procedure(s) Performed: AN AD HOC LABOR EPIDURAL     Patient location during evaluation: Mother Baby Anesthesia Type: Epidural Level of consciousness: awake, awake and alert and oriented Pain management: pain level controlled Vital Signs Assessment: post-procedure vital signs reviewed and stable Respiratory status: spontaneous breathing, nonlabored ventilation and respiratory function stable Cardiovascular status: stable Postop Assessment: no headache, patient able to bend at knees, no apparent nausea or vomiting, adequate PO intake and able to ambulate Anesthetic complications: no    Last Vitals:  Vitals:   10/23/19 2325 10/24/19 0305  BP: 116/81 105/78  Pulse: 100 97  Resp: 18 18  Temp: 37.1 C 36.8 C  SpO2: 100% 98%    Last Pain:  Vitals:   10/24/19 0533  TempSrc:   PainSc: 6    Pain Goal: Patients Stated Pain Goal: 6 (10/23/19 1920)                 Victorio Creeden

## 2019-10-25 MED ORDER — IBUPROFEN 600 MG PO TABS: 600.0000 mg | ORAL_TABLET | Freq: Three times a day (TID) | ORAL | 0 refills | Status: DC | PRN

## 2019-10-25 MED ORDER — ONDANSETRON 8 MG PO TBDP
8.0000 mg | ORAL_TABLET | Freq: Three times a day (TID) | ORAL | 0 refills | Status: DC | PRN
Start: 2019-10-25 — End: 2019-11-28

## 2019-10-25 MED ORDER — POLYETHYLENE GLYCOL 3350 17 GM/SCOOP PO POWD
17.0000 g | Freq: Every day | ORAL | 1 refills | Status: DC | PRN
Start: 2019-10-25 — End: 2019-11-28

## 2019-10-25 MED ORDER — ACETAMINOPHEN 325 MG PO TABS: 650.0000 mg | ORAL_TABLET | Freq: Four times a day (QID) | ORAL | 0 refills | Status: AC | PRN

## 2019-10-25 NOTE — Discharge Instructions (Signed)
Person Under Monitoring Name: Gabriella Wilson  Location: [address] [place] [id number]   Infection Prevention Recommendations for Individuals Confirmed to have, or Being Evaluated for, 2019 Novel Coronavirus (COVID-19) Infection Who Receive Care at Home  Individuals who are confirmed to have, or are being evaluated for, COVID-19 should follow the prevention steps below until a healthcare provider or local or state health department says they can return to normal activities.  Stay home except to get medical care You should restrict activities outside your home, except for getting medical care. Do not go to work, school, or public areas, and do not use public transportation or taxis.  Call ahead before visiting your doctor Before your medical appointment, call the healthcare provider and tell them that you have, or are being evaluated for, COVID-19 infection. This will help the healthcare provider's office take steps to keep other people from getting infected. Ask your healthcare provider to call the local or state health department.  Monitor your symptoms Seek prompt medical attention if your illness is worsening (e.g., difficulty breathing). Before going to your medical appointment, call the healthcare provider and tell them that you have, or are being evaluated for, COVID-19 infection. Ask your healthcare provider to call the local or state health department.  Wear a facemask You should wear a facemask that covers your nose and mouth when you are in the same room with other people and when you visit a healthcare provider. People who live with or visit you should also wear a facemask while they are in the same room with you. (The new guidelines regarding not wearing a facemask does not apply to you because you are considered contagious until 11/06/19).  Separate yourself from other people in your home As much as possible, you should stay in a different room from other people in your  home. Also, you should use a separate bathroom, if available.  Avoid sharing household items You should not share dishes, drinking glasses, cups, eating utensils, towels, bedding, or other items with other people in your home. After using these items, you should wash them thoroughly with soap and water.  Cover your coughs and sneezes Cover your mouth and nose with a tissue when you cough or sneeze, or you can cough or sneeze into your sleeve. Throw used tissues in a lined trash can, and immediately wash your hands with soap and water for at least 20 seconds or use an alcohol-based hand rub.  Wash your [employer]hands Wash your hands often and thoroughly with soap and water for at least 20 seconds. You can use an alcohol-based hand sanitizer if soap and water are not available and if your hands are not visibly dirty. Avoid touching your eyes, nose, and mouth with unwashed hands.   Prevention Steps for Caregivers and Household Members of Individuals Confirmed to have, or Being Evaluated for, COVID-19 Infection Being Cared for in the Home  If you live with, or provide care at home for, a person confirmed to have, or being evaluated for, COVID-19 infection please follow these guidelines to prevent infection:  Follow healthcare provider's instructions Make sure that you understand and can help the patient follow any healthcare provider instructions for all care.  Provide for the patient's basic needs You should help the patient with basic needs in the home and provide support for getting groceries, prescriptions, and other personal needs.  Monitor the patient's symptoms If they are getting sicker, call his or her medical provider and  tell them that the patient has, or is being evaluated for, COVID-19 infection. This will help the healthcare provider's office take steps to keep other people from getting infected. Ask the healthcare provider to call the local or state health department.  Limit the  number of people who have contact with the patient  If possible, have only one caregiver for the patient.  Other household members should stay in another home or place of residence. If this is not possible, they should stay  in another room, or be separated from the patient as much as possible. Use a separate bathroom, if available.  Restrict visitors who do not have an essential need to be in the home.  Keep older adults, very young children, and other sick people away from the patient Keep older adults, very young children, and those who have compromised immune systems or chronic health conditions away from the patient. This includes people with chronic heart, lung, or kidney conditions, diabetes, and cancer.  Ensure good ventilation Make sure that shared spaces in the home have good air flow, such as from an air conditioner or an opened window, weather permitting.  Wash your hands often  Wash your hands often and thoroughly with soap and water for at least 20 seconds. You can use an alcohol based hand sanitizer if soap and water are not available and if your hands are not visibly dirty.  Avoid touching your eyes, nose, and mouth with unwashed hands.  Use disposable paper towels to dry your hands. If not available, use dedicated cloth towels and replace them when they become wet.  Wear a facemask and gloves  Wear a disposable facemask at all times in the room and gloves when you touch or have contact with the patient's blood, body fluids, and/or secretions or excretions, such as sweat, saliva, sputum, nasal mucus, vomit, urine, or feces.  Ensure the mask fits over your nose and mouth tightly, and do not touch it during use.  Throw out disposable facemasks and gloves after using them. Do not reuse.  Wash your hands immediately after removing your facemask and gloves.  If your personal clothing becomes contaminated, carefully remove clothing and launder. Wash your hands after  handling contaminated clothing.  Place all used disposable facemasks, gloves, and other waste in a lined container before disposing them with other household waste.  Remove gloves and wash your hands immediately after handling these items.  Do not share dishes, glasses, or other household items with the patient  Avoid sharing household items. You should not share dishes, drinking glasses, cups, eating utensils, towels, bedding, or other items with a patient who is confirmed to have, or being evaluated for, COVID-19 infection.  After the person uses these items, you should wash them thoroughly with soap and water.  Wash laundry thoroughly  Immediately remove and wash clothes or bedding that have blood, body fluids, and/or secretions or excretions, such as sweat, saliva, sputum, nasal mucus, vomit, urine, or feces, on them.  Wear gloves when handling laundry from the patient.  Read and follow directions on labels of laundry or clothing items and detergent. In general, wash and dry with the warmest temperatures recommended on the label.  Clean all areas the individual has used often  Clean all touchable surfaces, such as counters, tabletops, doorknobs, bathroom fixtures, toilets, phones, keyboards, tablets, and bedside tables, every day. Also, clean any surfaces that may have blood, body fluids, and/or secretions or excretions on them.  Wear gloves when cleaning  surfaces the patient has come in contact with.  Use a diluted bleach solution (e.g., dilute bleach with 1 part bleach and 10 parts water) or a household disinfectant with a label that says EPA-registered for coronaviruses. To make a bleach solution at home, add 1 tablespoon of bleach to 1 quart (4 cups) of water. For a larger supply, add  cup of bleach to 1 gallon (16 cups) of water.  Read labels of cleaning products and follow recommendations provided on product labels. Labels contain instructions for safe and effective use of the  cleaning product including precautions you should take when applying the product, such as wearing gloves or eye protection and making sure you have good ventilation during use of the product.  Remove gloves and wash hands immediately after cleaning.  Monitor yourself for signs and symptoms of illness Caregivers and household members are considered close contacts, should monitor their health, and will be asked to limit movement outside of the home to the extent possible. Follow the monitoring steps for close contacts listed on the symptom monitoring form.   ? If you have additional questions, contact your local health department or call the epidemiologist on call at [phone] (available 24/7). ? This guidance is subject to change. For the most up-to-date guidance from [facility], please refer to their website: TripMetro.hu     Postpartum Care After Vaginal Delivery This sheet gives you information about how to care for yourself from the time you deliver your baby to up to 6-12 weeks after delivery (postpartum period). Your health care provider may also give you more specific instructions. If you have problems or questions, contact your health care provider. Follow these instructions at home: Vaginal bleeding  It is normal to have vaginal bleeding (lochia) after delivery. Wear a sanitary pad for vaginal bleeding and discharge. ? During the first week after delivery, the amount and appearance of lochia is often similar to a menstrual period. ? Over the next few weeks, it will gradually decrease to a dry, yellow-brown discharge. ? For most women, lochia stops completely by 4-6 weeks after delivery. Vaginal bleeding can vary from woman to woman.  Change your sanitary pads frequently. Watch for any changes in your flow, such as: ? A sudden increase in volume. ? A change in color. ? Large blood clots.  If you pass a blood clot from  your vagina, save it and call your health care provider to discuss. Do not flush blood clots down the toilet before talking with your health care provider.  Do not use tampons or douches until your health care provider says this is safe.  If you are not breastfeeding, your period should return 6-8 weeks after delivery. If you are feeding your child breast milk only (exclusive breastfeeding), your period may not return until you stop breastfeeding. Perineal care  Keep the area between the vagina and the anus (perineum) clean and dry as told by your health care provider. Use medicated pads and pain-relieving sprays and creams as directed.  If you had a cut in the perineum (episiotomy) or a tear in the vagina, check the area for signs of infection until you are healed. Check for: ? More redness, swelling, or pain. ? Fluid or blood coming from the cut or tear. ? Warmth. ? Pus or a bad smell.  You may be given a squirt bottle to use instead of wiping to clean the perineum area after you go to the bathroom. As you start healing, you may use  the squirt bottle before wiping yourself. Make sure to wipe gently.  To relieve pain caused by an episiotomy, a tear in the vagina, or swollen veins in the anus (hemorrhoids), try taking a warm sitz bath 2-3 times a day. A sitz bath is a warm water bath that is taken while you are sitting down. The water should only come up to your hips and should cover your buttocks. Breast care  Within the first few days after delivery, your breasts may feel heavy, full, and uncomfortable (breast engorgement). Milk may also leak from your breasts. Your health care provider can suggest ways to help relieve the discomfort. Breast engorgement should go away within a few days.  If you are breastfeeding: ? Wear a bra that supports your breasts and fits you well. ? Keep your nipples clean and dry. Apply creams and ointments as told by your health care provider. ? You may need to  use breast pads to absorb milk that leaks from your breasts. ? You may have uterine contractions every time you breastfeed for up to several weeks after delivery. Uterine contractions help your uterus return to its normal size. ? If you have any problems with breastfeeding, work with your health care provider or Science writer.  If you are not breastfeeding: ? Avoid touching your breasts a lot. Doing this can make your breasts produce more milk. ? Wear a good-fitting bra and use cold packs to help with swelling. ? Do not squeeze out (express) milk. This causes you to make more milk. Intimacy and sexuality  Ask your health care provider when you can engage in sexual activity. This may depend on: ? Your risk of infection. ? How fast you are healing. ? Your comfort and desire to engage in sexual activity.  You are able to get pregnant after delivery, even if you have not had your period. If desired, talk with your health care provider about methods of birth control (contraception). Medicines  Take over-the-counter and prescription medicines only as told by your health care provider.  If you were prescribed an antibiotic medicine, take it as told by your health care provider. Do not stop taking the antibiotic even if you start to feel better. Activity  Gradually return to your normal activities as told by your health care provider. Ask your health care provider what activities are safe for you.  Rest as much as possible. Try to rest or take a nap while your baby is sleeping. Eating and drinking   Drink enough fluid to keep your urine pale yellow.  Eat high-fiber foods every day. These may help prevent or relieve constipation. High-fiber foods include: ? Whole grain cereals and breads. ? Brown rice. ? Beans. ? Fresh fruits and vegetables.  Do not try to lose weight quickly by cutting back on calories.  Take your prenatal vitamins until your postpartum checkup or until your  health care provider tells you it is okay to stop. Lifestyle  Do not use any products that contain nicotine or tobacco, such as cigarettes and e-cigarettes. If you need help quitting, ask your health care provider.  Do not drink alcohol, especially if you are breastfeeding. General instructions  Keep all follow-up visits for you and your baby as told by your health care provider. Most women visit their health care provider for a postpartum checkup within the first 3-6 weeks after delivery. Contact a health care provider if:  You feel unable to cope with the changes that your child brings  to your life, and these feelings do not go away.  You feel unusually sad or worried.  Your breasts become red, painful, or hard.  You have a fever.  You have trouble holding urine or keeping urine from leaking.  You have little or no interest in activities you used to enjoy.  You have not breastfed at all and you have not had a menstrual period for 12 weeks after delivery.  You have stopped breastfeeding and you have not had a menstrual period for 12 weeks after you stopped breastfeeding.  You have questions about caring for yourself or your baby.  You pass a blood clot from your vagina. Get help right away if:  You have chest pain.  You have difficulty breathing.  You have sudden, severe leg pain.  You have severe pain or cramping in your lower abdomen.  You bleed from your vagina so much that you fill more than one sanitary pad in one hour. Bleeding should not be heavier than your heaviest period.  You develop a severe headache.  You faint.  You have blurred vision or spots in your vision.  You have bad-smelling vaginal discharge.  You have thoughts about hurting yourself or your baby. If you ever feel like you may hurt yourself or others, or have thoughts about taking your own life, get help right away. You can go to the nearest emergency department or call:  Your local  emergency services (911 in the [place]  A suicide crisis helpline, such as the National Suicide Prevention Lifeline at 603-082-9343. This is open 24 hours a day. Summary  The period of time right after you deliver your newborn up to 6-12 weeks after delivery is called the postpartum period.  Gradually return to your normal activities as told by your health care provider.  Keep all follow-up visits for you and your baby as told by your health care provider. This information is not intended to replace advice given to you by your health care provider. Make sure you discuss any questions you have with your health care provider. Document Revised: 05/26/2017 Document Reviewed: 03/06/2017 Elsevier Patient Education  2020 [employer].

## 2019-10-28 NOTE — Progress Notes (Signed)
Please refer to my office note 10/22/19 @ 1610 for documentation.

## 2019-11-28 ENCOUNTER — Encounter: Payer: Self-pay | Admitting: Women's Health

## 2019-11-28 ENCOUNTER — Ambulatory Visit (INDEPENDENT_AMBULATORY_CARE_PROVIDER_SITE_OTHER): Payer: Medicaid Other | Admitting: Women's Health

## 2019-11-28 DIAGNOSIS — Z8632 Personal history of gestational diabetes: Secondary | ICD-10-CM

## 2019-11-28 DIAGNOSIS — Z3A37 37 weeks gestation of pregnancy: Secondary | ICD-10-CM

## 2019-11-28 DIAGNOSIS — Z1389 Encounter for screening for other disorder: Secondary | ICD-10-CM

## 2019-11-28 DIAGNOSIS — Z331 Pregnant state, incidental: Secondary | ICD-10-CM

## 2019-11-28 NOTE — Progress Notes (Signed)
POSTPARTUM VISIT Patient name: Gabriella Wilson MRN [id number]  Date of birth: 1995/06/21 Chief Complaint:   Postpartum Care  History of Present Illness:   Gabriella Wilson is a 24 y.o. G85P1001 Caucasian female being seen today for a postpartum visit. She is 5 weeks postpartum following a spontaneous vaginal delivery at 37.3 gestational weeks after IOL for heavy vaginal bleeding. Anesthesia: epidural.  I have fully reviewed the prenatal and intrapartum course. Pregnancy complicated by L5QG. Postpartum course has been uncomplicated. Bleeding on period. Bowel function is normal. Bladder function is normal.  Patient is not sexually active. Last sexual activity: prior to birth of baby.    Patient's last menstrual period was 11/23/2019.  Baby's course has been uncomplicated. Baby is feeding by bottle    Flavia Shipper Postpartum Depression Screening: feels 'blah', h/o anxiety- was on abilify prior to pregnancy and did well with it, was rx'd by PCP [facility]. Has been having panic attacks recently. Denies SI/HI/II.   Edinburgh Postnatal Depression Scale - 11/28/19 1336      Edinburgh Postnatal Depression Scale:  In the Past 7 Days   I have been able to laugh and see the funny side of things. 1    I have looked forward with enjoyment to things. 1    I have blamed myself unnecessarily when things went wrong. 1    I have been anxious or worried for no good reason. 2    I have felt scared or panicky for no good reason. 2    Things have been getting on top of me. 2    I have been so unhappy that I have had difficulty sleeping. 1    I have felt sad or miserable. 2    I have been so unhappy that I have been crying. 0    The thought of harming myself has occurred to me. 0    Edinburgh Postnatal Depression Scale Total 12          Review of Systems:   Pertinent items are noted in HPI Denies Abnormal vaginal discharge w/ itching/odor/irritation, headaches, visual changes, shortness of breath, chest pain,  abdominal pain, severe nausea/vomiting, or problems with urination or bowel movements. Pertinent History Reviewed:  Reviewed past medical,surgical, obstetrical and family history.  Reviewed problem list, medications and allergies. OB History  Gravida Para Term Preterm AB Living  _0 SAB TAB Ectopic Multiple Live Births        0 1    # Outcome Date GA Lbr Len/2nd Weight Sex Delivery Anes PTL Lv  1 Term 10/23/19 [id number] / 01:47 7 lb 2.5 oz (3.245 kg) F Vag-Spont EPI  LIV     Birth Comments: wnl   Physical Assessment:   Vitals:   11/28/19 1334  BP: 128/83  Pulse: 91  Weight: 134 lb (60.8 kg)  Height: 5' 1.5" (1.562 m)  Body mass index is 24.91 kg/m.       Physical Examination:   General appearance: alert, well appearing, and in no distress  Mental status: alert, oriented to person, place, and time  Skin: warm & dry   Cardiovascular: normal heart rate noted   Respiratory: normal respiratory effort, no distress   Breasts: deferred, no complaints   Abdomen: soft, non-tender   Pelvic: examination not indicated, has appt for IUD tomorrow  Rectal: note examined  Extremities: no edema       No results found for this or any  previous visit (from the past 24 hour(s)).  Assessment & Plan:  1) Postpartum exam 2) 5 wks s/p SVB 3) Bottlefeeding 4) Depression screening 5) Contraception counseling  Essential components of care per ACOG recommendations:  1.  Mood and well being:  . Patient with positive depression screening today. On abilify prior to pregnancy and did well w/ it- to make appt w/ PCP to resume . Pre-existing mental health disorders? Yes  . Patient does not use tobacco. . Substance use disorder? No    2. Infant care and feeding:  . Patient currently breastfeeding? No  . Childcare strategy if returning to work/school: FOB, daycare . Infant has a pediatrician/family doctor? Yes  . Recommended that all caregivers be immunized for flu, pertussis and other  preventable communicable diseases . Pt does have material needs met such as stable housing, utilities, food and diapers. If not, referred to local resources for help obtaining these.  3. Sexuality, contraception and birth spacing . Provided guidance regarding sexuality, management of dyspareunia, and resumption of intercourse . Patient does not want a pregnancy in the near future.  Desired family size is 1 children.  . Discussed avoiding interpregnancy interval <60mhs and recommended birth spacing of 18 months . Reviewed forms of contraception. Patient desires IUD, has appt tomorrow, abstinence until after insertion.    4. Sleep and fatigue . Discussed coping options for fatigue and sleep disruption . Encouraged family/partner/community support of 4 hrs of uninterrupted sleep to help with mood and fatigue  5. Physical recovery  . Pt did not have a cesarean section. If yes, assessed incisional pain and providing guidance on normal vs prolonged recovery . Patient had a periurethral laceration, perineal healing and pain reviewed.  . Patient has urinary incontinence? No, fecal incontinence? No  . Patient is safe to resume physical activity. Discussed attainment of healthy weight.  6.  Chronic disease management . Discussed pregnancy complications if any, and their implications for future childbearing and long-term maternal health. . Review recommendations for prevention of recurrent pregnancy complications, such as 17 hydroxyprogesterone caproate to reduce risk for recurrent PTB not applicable, or aspirin to reduce risk of preeclampsia not applicable. . Pt had GDM: Yes. If yes, 2hr GTT scheduled: yes. . Reviewed medications and non-pregnant dosing including consideration of whether pt is breastfeeding using a reliable resource such as LactMed: not applicable . Referred for f/u w/ PCP or subspecialist providers as indicated: yes  7. Health maintenance . Last pap smear 08/06/19 and results were  abnormal  LSIL, repeat 19yr Mammogram at 4022yor earlier if indicated  Meds: No orders of the defined types were placed in this encounter.   Follow-up: Return for As scheduled tomorrow for IUD insertion.   No orders of the defined types were placed in this encounter.   [facility]WHB[facility]/24/2021 2:04 PM

## 2019-11-29 ENCOUNTER — Ambulatory Visit: Payer: Medicaid Other | Admitting: Women's Health

## 2019-12-18 ENCOUNTER — Other Ambulatory Visit: Payer: Self-pay

## 2019-12-20 MED ORDER — NORETHIN ACE-ETH ESTRAD-FE 1-20 MG-MCG PO TABS
1.0000 | ORAL_TABLET | Freq: Every day | ORAL | 11 refills | Status: DC
Start: 2019-12-20 — End: 2020-04-29

## 2019-12-20 NOTE — Telephone Encounter (Signed)
Pt wants OCs, not IUD, will rx Junel 1/20,on periods now, start Sunday and use condoms for 1 pack

## 2019-12-23 ENCOUNTER — Ambulatory Visit: Payer: Medicaid Other | Admitting: Adult Health

## 2020-01-29 ENCOUNTER — Other Ambulatory Visit (INDEPENDENT_AMBULATORY_CARE_PROVIDER_SITE_OTHER): Payer: Medicaid Other | Admitting: *Deleted

## 2020-01-29 ENCOUNTER — Other Ambulatory Visit (HOSPITAL_COMMUNITY)
Admission: RE | Admit: 2020-01-29 | Discharge: 2020-01-29 | Disposition: A | Discharge status: Home / Self Care | Payer: Medicaid Other | Source: Ambulatory Visit | Attending: Obstetrics & Gynecology | Admitting: Obstetrics & Gynecology

## 2020-01-29 DIAGNOSIS — Z113 Encounter for screening for infections with a predominantly sexual mode of transmission: Secondary | ICD-10-CM | POA: Insufficient documentation

## 2020-01-29 NOTE — Progress Notes (Signed)
Chart reviewed for nurse visit. Agree with plan of care.  Adline Potter, NP 01/29/2020 1:45 PM

## 2020-01-29 NOTE — Progress Notes (Signed)
   NURSE VISIT- STD  SUBJECTIVE:  Gabriella Wilson is a 24 y.o. G10P1001 GYN patient female here for a vaginal swab for STD screen. Recently had a new partner and did not use a condom.  She reports the following symptoms: none. Denies abnormal vaginal bleeding, significant pelvic pain, fever, or UTI symptoms.  OBJECTIVE:  There were no vitals taken for this visit.  Appears well, in no apparent distress  ASSESSMENT: Vaginal swab for STD screen  PLAN: Self-collected vaginal probe for Gonorrhea, Chlamydia, Trichomonas sent to lab Treatment: to be determined once results are received Follow-up as needed if symptoms persist/worsen, or new symptoms develop  Jobe Marker  01/29/2020 10:34 AM

## 2020-01-30 LAB — CERVICOVAGINAL ANCILLARY ONLY
Chlamydia: POSITIVE — AB
Comment: NEGATIVE
Comment: NEGATIVE
Comment: NORMAL
Neisseria Gonorrhea: NEGATIVE
Trichomonas: NEGATIVE

## 2020-01-31 ENCOUNTER — Encounter: Payer: Self-pay | Admitting: Adult Health

## 2020-01-31 ENCOUNTER — Telehealth: Payer: Self-pay | Admitting: Adult Health

## 2020-01-31 DIAGNOSIS — A749 Chlamydial infection, unspecified: Secondary | ICD-10-CM

## 2020-01-31 HISTORY — DX: Chlamydial infection, unspecified: A74.9

## 2020-01-31 MED ORDER — AZITHROMYCIN 500 MG PO TABS: ORAL_TABLET | ORAL | 0 refills | Status: DC

## 2020-01-31 NOTE — Telephone Encounter (Signed)
Mailbox is full +chlamydia on vaginal swab will rx azithromycin 500 mg #2 2 po, no sex for 4 weeks then proof of treatment, partner needs to be treated, [facility] sent.

## 2020-02-03 ENCOUNTER — Telehealth: Payer: Self-pay | Admitting: Adult Health

## 2020-02-03 NOTE — Telephone Encounter (Signed)
Left message to call me about +test and that she saw the message and took her meds

## 2020-02-05 ENCOUNTER — Telehealth: Payer: Self-pay | Admitting: Adult Health

## 2020-02-05 NOTE — Telephone Encounter (Signed)
VM full, sent SMS notification 

## 2020-02-06 ENCOUNTER — Telehealth: Payer: Self-pay | Admitting: Adult Health

## 2020-02-06 NOTE — Telephone Encounter (Signed)
Pt aware of +chlamydia and has taken medicine, no sex and appt made for POC, self swab 9/28 at 10 am

## 2020-02-20 ENCOUNTER — Ambulatory Visit: Payer: Medicaid Other | Admitting: Orthopaedic Surgery

## 2020-03-02 NOTE — Progress Notes (Deleted)
Psychiatric Initial Adult Assessment   Patient Identification: Gabriella Wilson MRN:  [id number] Date of Evaluation:  03/02/2020 Referral Source: *** Chief Complaint:   Visit Diagnosis: No diagnosis found.  History of Present Illness:   Gabriella Wilson is a 24 y.o. year old female with a history of depression, intermittent explosive disorder,  ADHD, asthma, migraine, who is referred for depression.        Associated Signs/Symptoms: Depression Symptoms:  {DEPRESSION SYMPTOMS:20000} (Hypo) Manic Symptoms:  {BHH MANIC SYMPTOMS:22872} Anxiety Symptoms:  {BHH ANXIETY SYMPTOMS:22873} Psychotic Symptoms:  {BHH PSYCHOTIC SYMPTOMS:22874} PTSD Symptoms: {BHH PTSD DUKGURKY:70623}  Past Psychiatric History:  Outpatient: seen by Dr. Tenny Craw in 2018 for depression, intermittent explosive disorder,  ADHD Psychiatry admission:  Previous suicide attempt:  Past trials of medication:  History of violence:   Previous Psychotropic Medications: {YES/NO:21197}  Substance Abuse History in the last 12 months:  {yes no:314532}  Consequences of Substance Abuse: {BHH CONSEQUENCES OF SUBSTANCE ABUSE:22880}  Past Medical History:  Past Medical History:  Diagnosis Date  . Asthma   . Cat allergies    allergic to dogs  . Chlamydia 01/31/2020   Treated 8/27 POC___________  . Depression   . Oppositional defiant disorder     Past Surgical History:  Procedure Laterality Date  . bilateral tubes in ears     when younger  . CHOLECYSTECTOMY    . KNEE SURGERY Left 2016  . tubes in ears when an infant      Family Psychiatric History: ***  Family History:  Family History  Problem Relation Age of Onset  . Depression Mother   . Anxiety disorder Mother   . OCD Mother   . Alcohol abuse Father   . Depression Father   . Drug abuse Father   . ADD / ADHD Father   . Anxiety disorder Father   . Depression Paternal Aunt   . Depression Maternal Uncle   . Depression Maternal Grandmother   . Anxiety disorder  Maternal Grandmother   . Anxiety disorder Maternal Aunt   . Depression Maternal Aunt   . Dementia Paternal Grandmother        PGGM  . Paranoid behavior Neg Hx   . Schizophrenia Neg Hx   . Seizures Neg Hx   . Sexual abuse Neg Hx   . Physical abuse Neg Hx     Social History:   Social History   Socioeconomic History  . Marital status: Single    Spouse name: Not on file  . Number of children: Not on file  . Years of education: Not on file  . Highest education level: Not on file  Occupational History  . Not on file  Tobacco Use  . Smoking status: Never Smoker  . Smokeless tobacco: Never Used  Vaping Use  . Vaping Use: Never used  Substance and Sexual Activity  . Alcohol use: No  . Drug use: No  . Sexual activity: Yes    Birth control/protection: None, Condom  Other Topics Concern  . Not on file  Social History Narrative  . Not on file   Social Determinants of Health   Financial Resource Strain: Low Risk   . Difficulty of Paying Living Expenses: Not hard at all  Food Insecurity: No Food Insecurity  . Worried About Programme researcher, broadcasting/film/video in the Last Year: Never true  . Ran Out of Food in the Last Year: Never true  Transportation Needs: No Transportation Needs  . Lack of Transportation (Medical): No  .  Lack of Transportation (Non-Medical): No  Physical Activity: Insufficiently Active  . Days of Exercise per Week: 3 days  . Minutes of Exercise per Session: 40 min  Stress: Stress Concern Present  . Feeling of Stress : To some extent  Social Connections: Moderately Isolated  . Frequency of Communication with Friends and Family: More than three times a week  . Frequency of Social Gatherings with Friends and Family: Twice a week  . Attends Religious Services: 1 to 4 times per year  . Active Member of Clubs or Organizations: No  . Attends Banker Meetings: Never  . Marital Status: Never married    Additional Social History: ***  Allergies:   Allergies   Allergen Reactions  . Peanuts [Peanut Oil] Anaphylaxis  . Amoxicillin-Pot Clavulanate   . Biaxin [Clarithromycin] Nausea Only  . Erythromycin Nausea Only  . Other Nausea Only    Codeine  . Sulfa Antibiotics Nausea Only    Metabolic Disorder Labs: No results found for: HGBA1C, MPG No results found for: PROLACTIN No results found for: CHOL, TRIG, HDL, CHOLHDL, VLDL, LDLCALC No results found for: TSH  Therapeutic Level Labs: No results found for: LITHIUM No results found for: CBMZ No results found for: VALPROATE  Current Medications: Current Outpatient Medications  Medication Sig Dispense Refill  . acetaminophen (TYLENOL) 325 MG tablet Take 2 tablets (650 mg total) by mouth every 6 (six) hours as needed (for pain scale < 4). (Patient not taking: Reported on 01/29/2020) 30 tablet 0  . albuterol (VENTOLIN HFA) 108 (90 Base) MCG/ACT inhaler SMARTSIG:1 Puff(s) Via Inhaler 4 Times Daily PRN    . ARIPiprazole (ABILIFY) 5 MG tablet Take 5 mg by mouth daily.    Marland Kitchen azithromycin (ZITHROMAX) 500 MG tablet Take 2 po now, eat first 2 tablet 0  . ibuprofen (ADVIL) 600 MG tablet Take 1 tablet (600 mg total) by mouth every 8 (eight) hours as needed for mild pain. 30 tablet 0  . loratadine (CLARITIN) 10 MG tablet Take 10 mg by mouth daily.    Marland Kitchen LORazepam (ATIVAN) 0.5 MG tablet Take 0.5 mg by mouth 2 (two) times daily as needed.    . Multiple Vitamin (MULTIVITAMIN ADULT PO) Take by mouth.    . norethindrone-ethinyl estradiol (LOESTRIN FE) 1-20 MG-MCG tablet Take 1 tablet by mouth daily. 28 tablet 11   No current facility-administered medications for this visit.    Musculoskeletal: Strength & Muscle Tone: N/A Gait & Station: N/A Patient leans: N/A  Psychiatric Specialty Exam: Review of Systems  not currently breastfeeding.There is no height or weight on file to calculate BMI.  General Appearance: {Appearance:22683}  Eye Contact:  {BHH EYE CONTACT:22684}  Speech:  Clear and Coherent  Volume:   Normal  Mood:  {BHH MOOD:22306}  Affect:  {Affect (PAA):22687}  Thought Process:  Coherent  Orientation:  Full (Time, Place, and Person)  Thought Content:  Logical  Suicidal Thoughts:  {ST/HT (PAA):22692}  Homicidal Thoughts:  {ST/HT (PAA):22692}  Memory:  Immediate;   Good  Judgement:  {Judgement (PAA):22694}  Insight:  {Insight (PAA):22695}  Psychomotor Activity:  Normal  Concentration:  Concentration: Good and Attention Span: Good  Recall:  Good  Fund of Knowledge:Good  Language: Good  Akathisia:  No  Handed:  Right  AIMS (if indicated):  not done  Assets:  Communication Skills Desire for Improvement  ADL's:  Intact  Cognition: WNL  Sleep:  {BHH GOOD/FAIR/POOR:22877}   Screenings: PHQ2-9     Initial Prenatal from 05/06/2019 in  [facility]  PHQ-2 Total Score 1      Assessment and Plan:  Assessment  Plan  The patient demonstrates the following risk factors for suicide: Chronic risk factors for suicide include: {Chronic Risk Factors for QIHKVQQ:59563875}. Acute risk factors for suicide include: {Acute Risk Factors for IEPPIRJ:18841660}. Protective factors for this patient include: {Protective Factors for Suicide YTKZ:60109323}. Considering these factors, the overall suicide risk at this point appears to be {Desc; low/moderate/high:110033}. Patient {ACTION; IS/IS FTD:32202542} appropriate for outpatient follow up.         Neysa Hotter, MD 9/27/20214:31 PM

## 2020-03-03 ENCOUNTER — Other Ambulatory Visit: Payer: Medicaid Other

## 2020-03-05 ENCOUNTER — Telehealth (HOSPITAL_COMMUNITY): Payer: Medicaid Other | Admitting: Psychiatry

## 2020-04-28 ENCOUNTER — Telehealth: Payer: Self-pay | Admitting: *Deleted

## 2020-04-28 ENCOUNTER — Other Ambulatory Visit (HOSPITAL_COMMUNITY)
Admission: RE | Admit: 2020-04-28 | Discharge: 2020-04-28 | Disposition: A | Discharge status: Home / Self Care | Payer: Medicaid Other | Source: Ambulatory Visit | Attending: Obstetrics & Gynecology | Admitting: Obstetrics & Gynecology

## 2020-04-28 ENCOUNTER — Other Ambulatory Visit: Payer: Self-pay

## 2020-04-28 ENCOUNTER — Other Ambulatory Visit (INDEPENDENT_AMBULATORY_CARE_PROVIDER_SITE_OTHER): Payer: Medicaid Other | Admitting: *Deleted

## 2020-04-28 DIAGNOSIS — A749 Chlamydial infection, unspecified: Secondary | ICD-10-CM | POA: Diagnosis not present

## 2020-04-28 DIAGNOSIS — N939 Abnormal uterine and vaginal bleeding, unspecified: Secondary | ICD-10-CM | POA: Diagnosis not present

## 2020-04-28 NOTE — Progress Notes (Signed)
   NURSE VISIT- VAGINITIS/STD/POC  SUBJECTIVE:  Gabriella Wilson is a 24 y.o. G1P1001 GYN patientfemale here for a vaginal swab for proof of cure after treatment for Chlamydia.  She reports the following symptoms: abnormal bleeding: heavy with clots for 3 days. Denies abnormal vaginal bleeding, significant pelvic pain, fever, or UTI symptoms.  OBJECTIVE:  There were no vitals taken for this visit.  Appears well, in no apparent distress  ASSESSMENT: Vaginal swab for proof of cure after treatment for chlamydia  PLAN: Self-collected vaginal probe for Chlamydia sent to lab Treatment: to be determined once results are received Follow-up as needed if symptoms persist/worsen, or new symptoms develop  Annamarie Dawley  04/28/2020 11:13 AM

## 2020-04-28 NOTE — Progress Notes (Signed)
Chart reviewed for nurse visit. Agree with plan of care.  Cyril Mourning A, NP 04/28/2020 1:12 PM

## 2020-04-28 NOTE — Telephone Encounter (Signed)
Patient states she has had heavy bleeding for 3 days along with clots and abdominal pain.  LMP 10/28. Taking COC's daily as prescribed but has not had this issue before.  Patient has not had POC.  Will have her come in today for POC.

## 2020-04-29 ENCOUNTER — Telehealth: Payer: Self-pay | Admitting: Adult Health

## 2020-04-29 ENCOUNTER — Ambulatory Visit (INDEPENDENT_AMBULATORY_CARE_PROVIDER_SITE_OTHER): Payer: Medicaid Other | Admitting: Adult Health

## 2020-04-29 ENCOUNTER — Encounter: Payer: Self-pay | Admitting: Adult Health

## 2020-04-29 VITALS — BP 118/83 | HR 94 | Ht 61.5 in | Wt 143.0 lb

## 2020-04-29 DIAGNOSIS — A749 Chlamydial infection, unspecified: Secondary | ICD-10-CM

## 2020-04-29 DIAGNOSIS — R10813 Right lower quadrant abdominal tenderness: Secondary | ICD-10-CM

## 2020-04-29 DIAGNOSIS — N920 Excessive and frequent menstruation with regular cycle: Secondary | ICD-10-CM | POA: Diagnosis not present

## 2020-04-29 DIAGNOSIS — Z3041 Encounter for surveillance of contraceptive pills: Secondary | ICD-10-CM | POA: Diagnosis not present

## 2020-04-29 DIAGNOSIS — F419 Anxiety disorder, unspecified: Secondary | ICD-10-CM | POA: Diagnosis not present

## 2020-04-29 LAB — POCT HEMOGLOBIN: Hemoglobin: 12.3 g/dL (ref 11–14.6)

## 2020-04-29 LAB — CERVICOVAGINAL ANCILLARY ONLY
Bacterial Vaginitis (gardnerella): NEGATIVE
Candida Glabrata: NEGATIVE
Candida Vaginitis: POSITIVE — AB
Chlamydia: POSITIVE — AB
Comment: NEGATIVE
Comment: NEGATIVE
Comment: NEGATIVE
Comment: NEGATIVE
Comment: NEGATIVE
Comment: NORMAL
Neisseria Gonorrhea: NEGATIVE
Trichomonas: NEGATIVE

## 2020-04-29 MED ORDER — NORETHINDRONE 0.35 MG PO TABS
1.0000 | ORAL_TABLET | Freq: Every day | ORAL | 11 refills | Status: DC
Start: 2020-04-29 — End: 2020-09-03

## 2020-04-29 MED ORDER — BUSPIRONE HCL 5 MG PO TABS: 5.0000 mg | ORAL_TABLET | Freq: Three times a day (TID) | ORAL | 1 refills | Status: DC

## 2020-04-29 MED ORDER — FLUCONAZOLE 150 MG PO TABS
150.0000 mg | ORAL_TABLET | Freq: Every day | ORAL | 1 refills | Status: DC
Start: 2020-04-29 — End: 2021-03-03

## 2020-04-29 MED ORDER — AZITHROMYCIN 500 MG PO TABS: ORAL_TABLET | ORAL | 0 refills | Status: DC

## 2020-04-29 MED ORDER — METRONIDAZOLE 500 MG PO TABS
500.0000 mg | ORAL_TABLET | Freq: Two times a day (BID) | ORAL | 0 refills | Status: DC
Start: 2020-04-29 — End: 2020-04-29

## 2020-04-29 NOTE — Progress Notes (Signed)
  Subjective:     Patient ID: Gabriella Wilson, female   DOB: 11/27/1995, 24 y.o.   MRN: [id number]  HPI [name] is a 24 year old white female,single, G1P1 in complaining of heavy bleeding last 3 days. She came in yesterday and did self swab for STD and was worked in today.  She says she knows her body and has never bleed like this, she did miss 1-2 pills last month.  She is aware that CV swab not resulted yet.  Review of Systems Having heavy bleeding for last 3 days with clots and cramps Increased anxiety, has ativan from PCP but not helping  Reviewed past medical,surgical, social and family history. Reviewed medications and allergies.      Objective:   Physical Exam BP 118/83 (BP Location: Left Arm, Patient Position: Sitting, Cuff Size: Normal)   Pulse 94   Ht 5' 1.5" (1.562 m)   Wt 143 lb (64.9 kg)   LMP 04/26/2020   Breastfeeding No   BMI 26.58 kg/m HGB 12.3 Skin warm and dry.Pelvic: external genitalia is normal in appearance no lesions, vagina: dark period blood,urethra has no lesions or masses noted, cervix:smooth and bulbous,noo CMT, uterus: normal size, shape and contour, non tender, no masses felt, adnexa: no masses,RLQ tenderness noted. Bladder is non tender and no masses felt.     Upstream - 04/29/20 0858      Pregnancy Intention Screening   Does the patient want to become pregnant in the next year? No    Does the patient's partner want to become pregnant in the next year? No    Would the patient like to discuss contraceptive options today? Yes      Contraception Wrap Up   Current Method Oral Contraceptive    End Method Oral Contraceptive    Contraception Counseling Provided Yes         Examination chaperoned by Lawanna Kobus RN  Assessment:     1. Menorrhagia with regular cycle Will change OCs to micronor,finish current pill pack, and  start Sunday and use condoms Meds ordered this encounter  Medications  . busPIRone (BUSPAR) 5 MG tablet    Sig: Take 1 tablet (5 mg  total) by mouth 3 (three) times daily.    Dispense:  90 tablet    Refill:  1    Order Specific Question:   Supervising Provider    Answer:   Despina Hidden, LUTHER H [[id number]]  . norethindrone (MICRONOR) 0.35 MG tablet    Sig: Take 1 tablet (0.35 mg total) by mouth daily.    Dispense:  28 tablet    Refill:  11    Order Specific Question:   Supervising Provider    Answer:   Lazaro Arms [[id number]]   Will get GYN [place] in about a week   2. Encounter for surveillance of contraceptive pills Will rx micronor  3. Right lower quadrant abdominal tenderness without rebound tenderness Will get GYN [place]  4. Anxiety Will rx buspar 5 mg tid     Plan:     Follow up in 3 months or sooner if needed

## 2020-04-29 NOTE — Telephone Encounter (Signed)
Left message that Vaginal swab +chlaymuda and BV, rx sent for flagyl and azithromycin to [facility], no sex will talk Monday PCO in 4 weeks and [facility] sent. Will let her know partner needs to be treated too.

## 2020-04-29 NOTE — Telephone Encounter (Signed)
Left message that it was yeast and chlamydia not BV and rx sent.

## 2020-05-04 ENCOUNTER — Encounter: Payer: Self-pay | Admitting: *Deleted

## 2020-05-04 ENCOUNTER — Ambulatory Visit: Payer: Medicaid Other | Admitting: Adult Health

## 2020-05-27 ENCOUNTER — Telehealth: Payer: Self-pay | Admitting: Adult Health

## 2020-05-27 ENCOUNTER — Other Ambulatory Visit: Payer: Self-pay | Admitting: Adult Health

## 2020-05-27 MED ORDER — AZITHROMYCIN 500 MG PO TABS: ORAL_TABLET | ORAL | 0 refills | Status: DC

## 2020-05-27 NOTE — Telephone Encounter (Signed)
Pt called and informed that Micronor can cause periods to stop. She seemed reassured.

## 2020-05-27 NOTE — Telephone Encounter (Signed)
Returned pt's call. Pt states that she switched to a different BC pill in October and has had a noticeable change in menstruation. She only spotted in November and has yet to start her period for December. She has taken multiple pregnancy tests (20-30), all negative. She was reassured that it is likely her body adjusting to the new medication. She was instructed to keep dates of bleeding in her calendar and to call if it continues to be an issue. She can call and schedule an appt if she feels she wants to transition to a diff BC to have a regular cycle.

## 2020-05-27 NOTE — Telephone Encounter (Signed)
Pt called concerned that she's not started her period yet, states she had a regular cycle in October, a little spotting during her period week in late November but has not started her cycle for December, states she's not even having any pre-period symptoms  States she's taken about 30 home preg tests that are all negative   States she's never skipped a period while on [facility] pills  Please advise

## 2020-05-27 NOTE — Progress Notes (Signed)
Will rx azithromycin

## 2020-06-06 IMAGING — US US ABDOMEN LIMITED
1 series · 14 of 25 positions shown · non-contrast
Comparison: None.

CLINICAL DATA: Right upper quadrant/flank pain 2 weeks. Previous
cholecystectomy. Patient is 32 weeks pregnant.

EXAM:
ULTRASOUND ABDOMEN LIMITED RIGHT UPPER QUADRANT

[Series 1: us abdomen limited ruq · 14 of 35 slices shown]
[im 1/35]
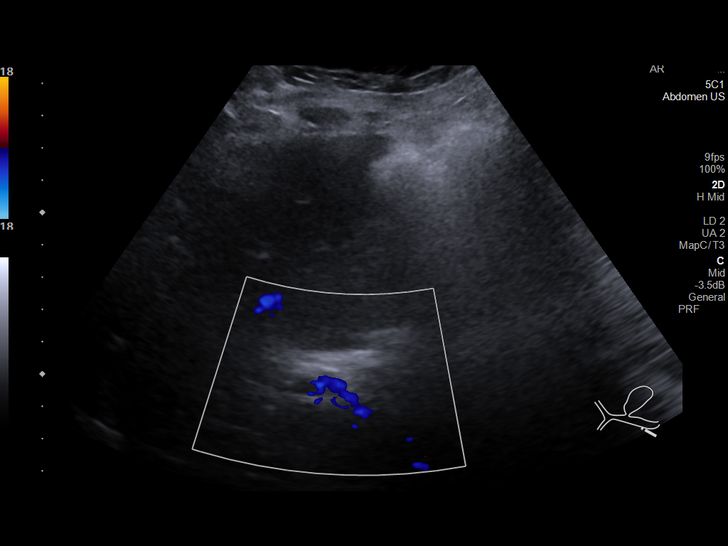
[im 3/35]
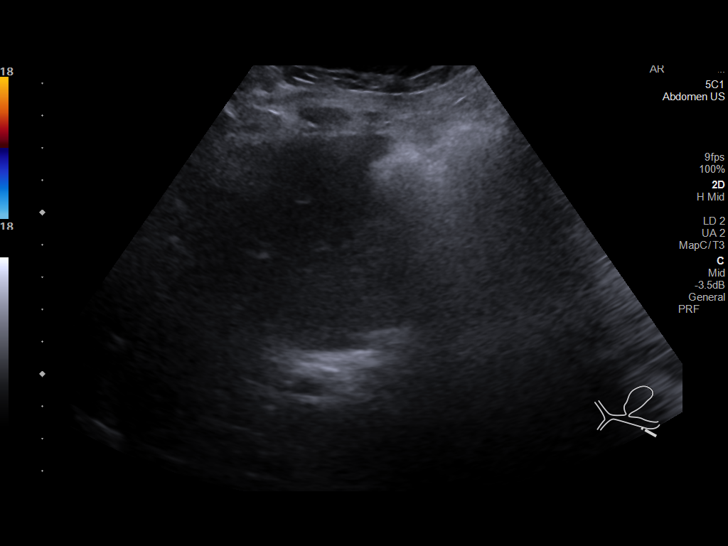
[im 6/35]
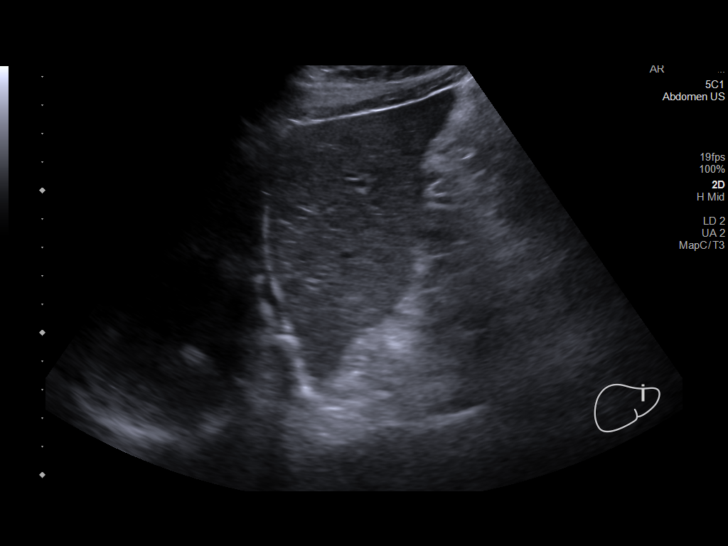
[im 9/35]
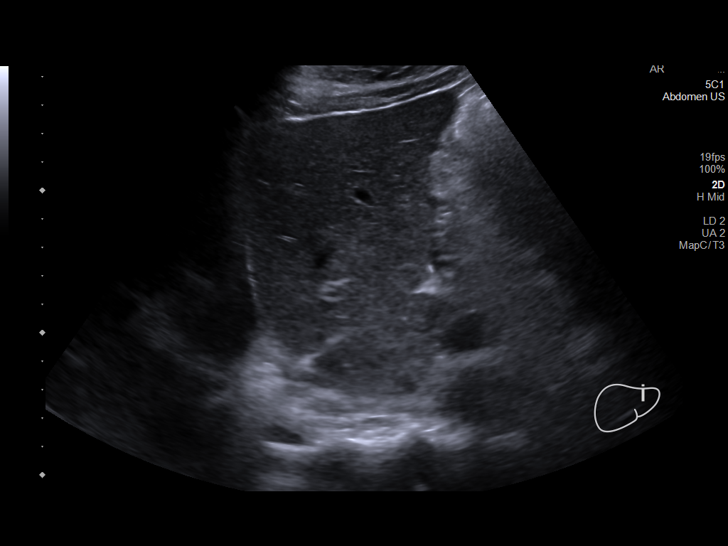
[im 12/35]
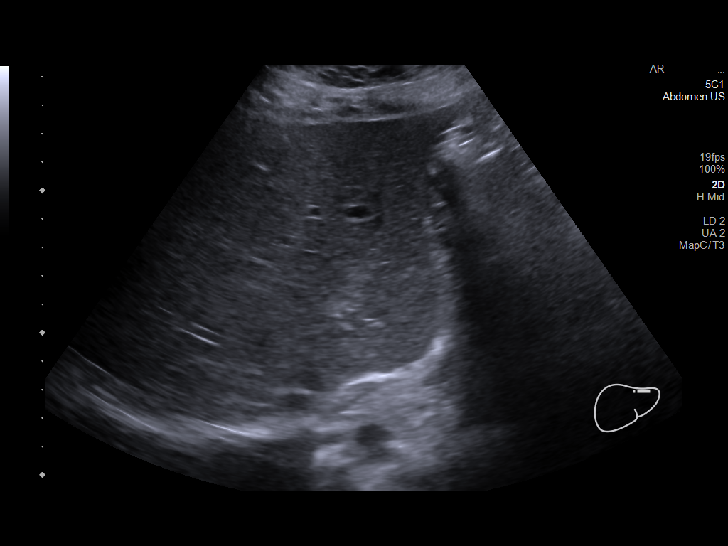
[im 13/35]
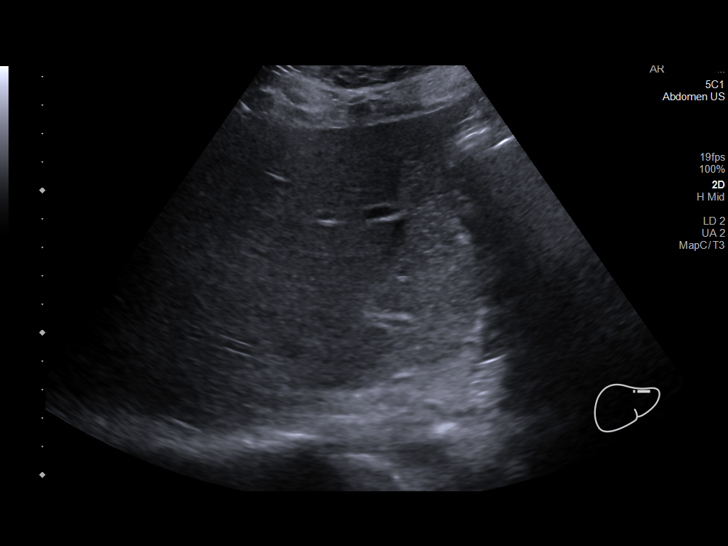
[im 16/35]
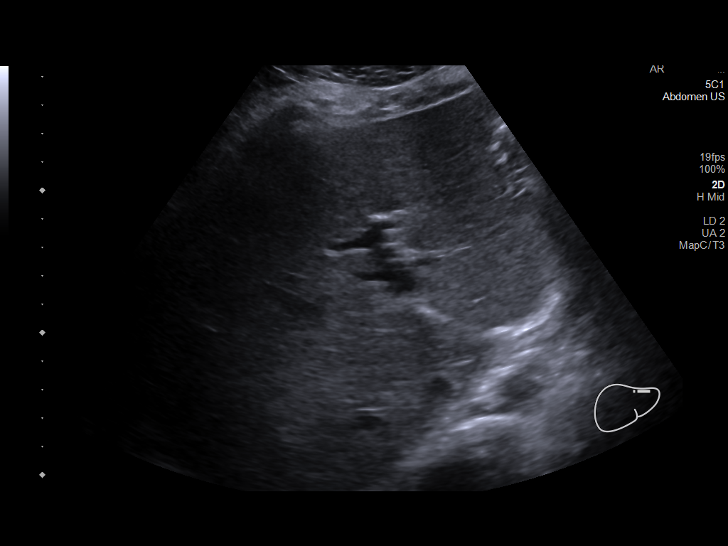
[im 19/35]
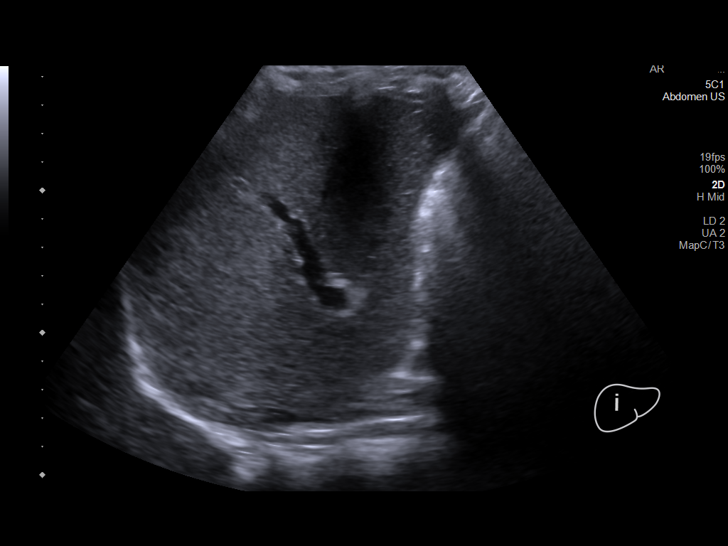
[im 22/35]
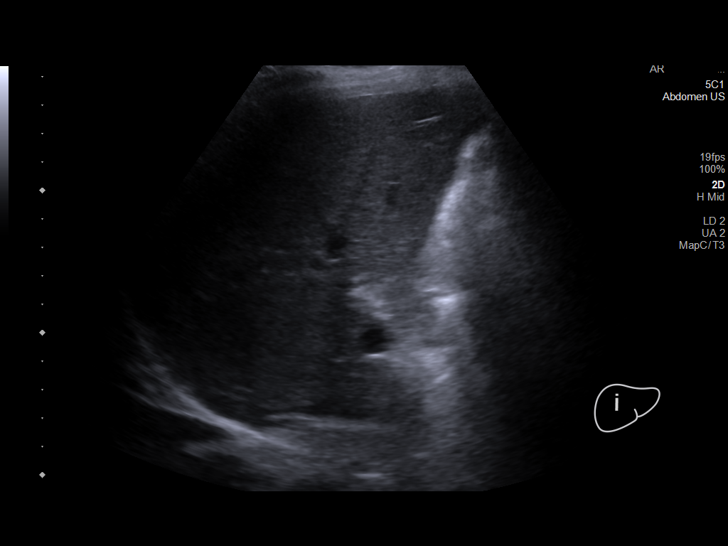
[im 23/35]
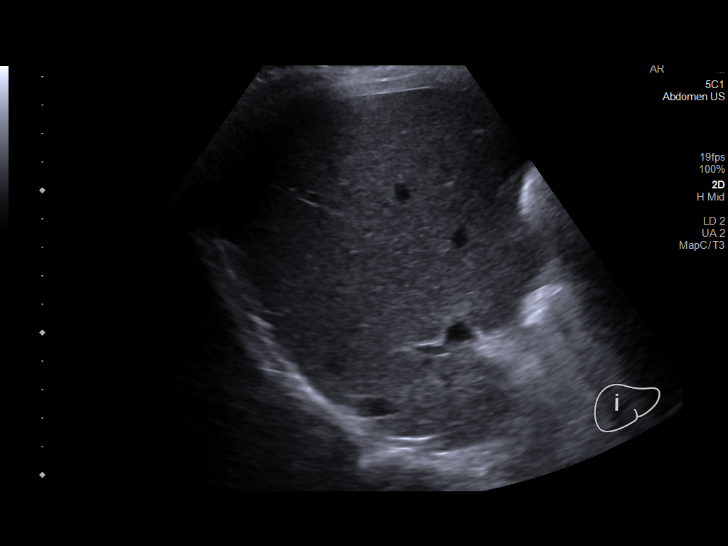
[im 26/35]
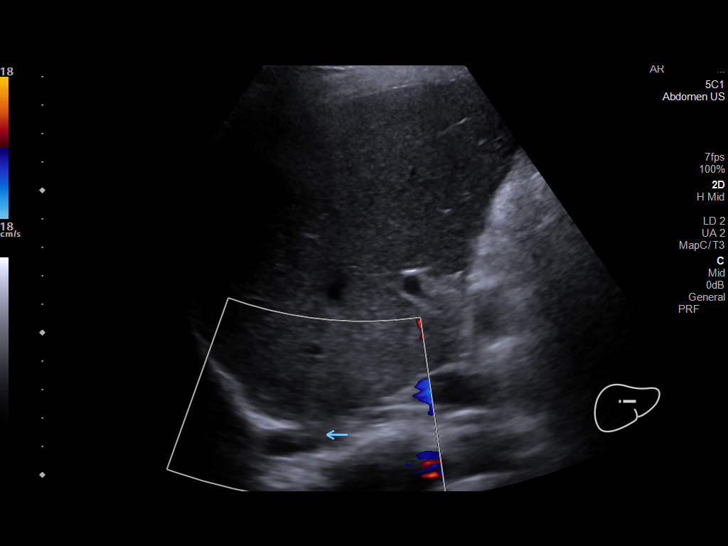
[im 29/35]
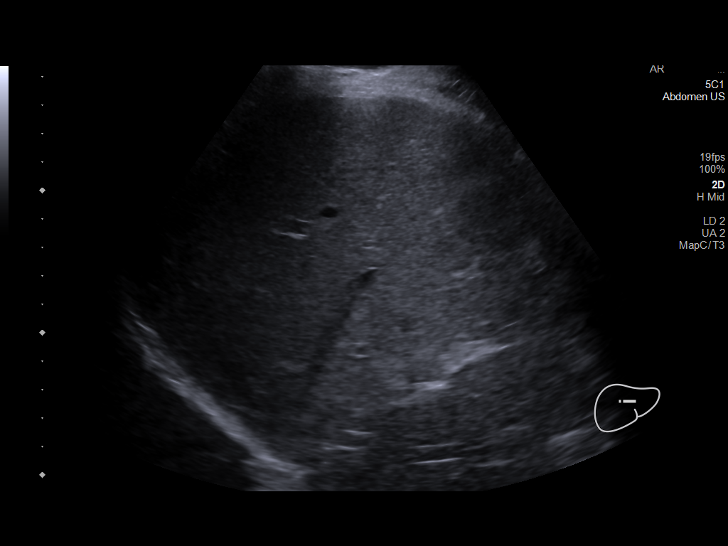
[im 32/35]
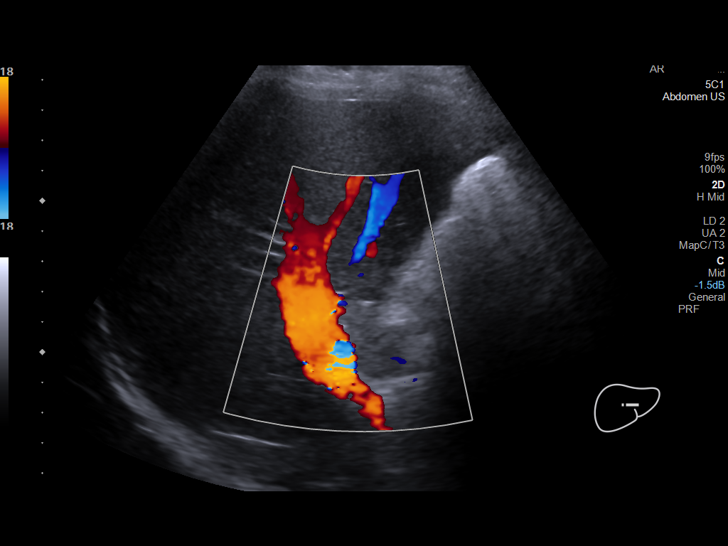
[im 35/35]
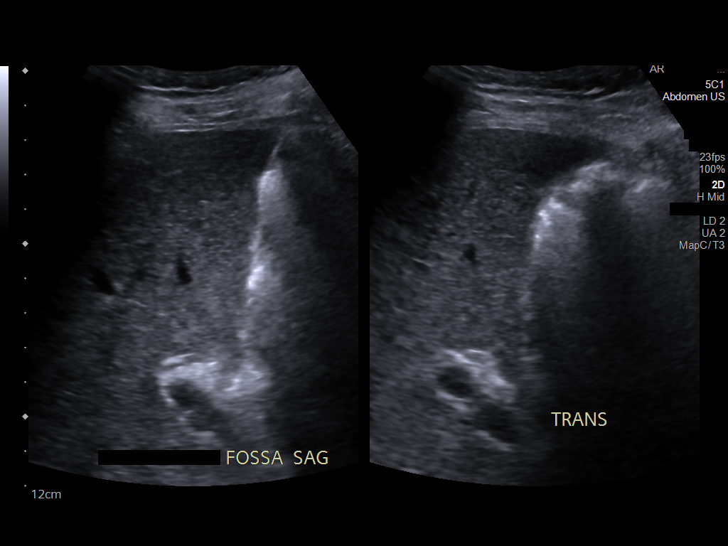

[14 of 25 positions shown; findings below may reference images not displayed]

FINDINGS: Gallbladder:

Previous cholecystectomy.

Common bile duct:

Diameter: 3 mm.

Liver:

No focal lesion identified. Within normal limits in parenchymal
echogenicity. Portal vein is patent on color Doppler imaging with
normal direction of blood flow towards the liver.

Other: Tiny amount right pleural fluid.
IMPRESSION: 1.  No acute findings.  Previous cholecystectomy.

2.  Tiny amount right pleural fluid.

## 2020-07-30 ENCOUNTER — Ambulatory Visit: Payer: MEDICAID

## 2020-07-30 DIAGNOSIS — Z369 Encounter for antenatal screening, unspecified: Secondary | ICD-10-CM

## 2020-07-30 DIAGNOSIS — Z8279 Family history of other congenital malformations, deformations and chromosomal abnormalities: Secondary | ICD-10-CM

## 2020-07-30 DIAGNOSIS — O352XX0 Maternal care for (suspected) hereditary disease in fetus, not applicable or unspecified: Secondary | ICD-10-CM

## 2020-07-30 DIAGNOSIS — Z8489 Family history of other specified conditions: Secondary | ICD-10-CM

## 2020-07-30 NOTE — Progress Notes (Addendum)
I performed this clinical encounter by utilizing a real time phone connection between my location and the patient's location within [place]. After confirming 3 patient identifiers, I obtained verbal consent from the patient to perform this clinical encounter utilizing the telephone and prepared the patient by answering any questions they had.    I spent a total of 60 minutes today on this patient's visit. This included 45 minutes of face-to-face time, more than 90% of which was spent in counseling or coordinating care for the following medical subject(s): discussion of general prenatal testing options. The remainder of the time was spent on review of the patient record (including, but not limited to, clinical notes, outside records, laboratory, and radiographic studies), answering patient questions, medical decision-making, and documentation of the visit.     Re: Lisa Ruiz Ruiz     MR#: [id number]  DOB: 1995-08-15  Date of Service: 07/30/2020     Dear Dr. Richardson Ruiz,        [name]  [name], [name]  [name]  [place], [name] [address]  Phone: [phone] Fax: [phone]       Thank you for referring Lisa Ruiz Ruiz to the [name] for genetic assessment and counseling. We spoke with Lisa Ruiz Ruiz over telephone on July 30, 2020 to review general prenatal testing options for her current pregnancy. Lisa Ruiz Ruiz attended the appointment by herself.      Pregnancy History   [id number]  G1; 2020; Early miscarriage   G2; March 2020; Lisa Ruiz; Term; NSVD; Alive and well   G3;   Gestational age on day of appointment based on reported LMP (05/03/2020); [redacted]w[redacted]d  Gestational age on day of appointment based on first trimester ultrasound (07/22/2020); 166w1dNo alcohol, tobacco or illicit drug use reported   No hyperemesis, bleeding, fever, infection, or diabetes reported  Prenatal vitamins  No other complications during the pregnancy were noted    Known  investigations   Ultrasound; 07/22/2020; Lisa Ruiz Ruiz  GA: 1318w0d  Additional testing ([facility], non-invasive prenatal screening, ultrasounds, carrier screening) were not received/could not be obtained.   Family History   The family histories are as reported. Diagnoses and genetic test results have not been confirmed unless specified. Lisa Ruiz Ruiz her partner are non-consanguineous.      Lisa Ruiz Ruiz a 22 3ar old (at EDD: 01/27/2021) woman of African American ancestry who reported being in good overall health.     Sibship   Brother; deceased Lisa Ruiz Lisa Ruiz Ruiz of age from a "Lisa Ruiz Ruiz"; "born sick; tense body all the time; tube-fed"; zero developmental progress; unknown if he had seizures; believed vision and hearing were "perfect"     Maternal History    Uncle; deceased Lisa Ruiz Lisa Ruiz years of age from same condition as above     Lisa Ruiz Ruiz that both her mother and grandmother "passed on the condition"; but they themselves have never had symptoms. She had limited information regarding additional features and regarding a specific diagnosis.     Partner's family history   Lisa Ruiz Ruiz's partner, Lisa Ruiz Ruiz a 21 59ar old Lisa Ruiz of African American ancestry who was reported being in good overall health. Lisa Ruiz Ruiz adopted out Lisa Ruiz birth; there was limited family history information.    Lisa Ruiz Ruiz and Lisa Ruiz Ruiz's maternal and paternal histories were otherwise negative for congenital abnormalities (CHD, Lisa Ruiz Ruiz, ONTDs), excessive neck skin, bleeding disorders, noticeable short stature, muscular and/or skeletal anomalies, intellectual disability, vision or hearing loss, sudden death, syncope, major medical  concerns, learning disabilities, multiple miscarriages, genetic conditions (cystic fibrosis, cancer under 37 years), developmental regression, stillbirths, or neonatal deaths.    Family history of unspecified Lisa Ruiz condition   We spent time reviewing the principles of chromosomes (numbering,  pairing, autosomes, sex chromosomes, aneuploidy), and genes (pathogenic variation, autosomal recessive and Lisa Ruiz inheritance).    Family history We reviewed with Lisa Ruiz Ruiz that her reported family history is consistent with Lisa Ruiz Ruiz and is familial (not de novo). We discussed that carrier females are typically asymptomatic (due to having a 'back-up' X chromosome), and Lisa Ruiz carriers are affected/symptomatic (as they only have one X chromosome).   Risk assessment for Lisa Ruiz Ruiz We reviewed with Lisa Ruiz Ruiz that provided that her mother is a carrier for the familial Lisa Ruiz condition; Lisa Ruiz Ruiz is Lisa Ruiz a 50% chance of being a carrier and a 50% of being a non-carrier.   Risk assessment for pregnancy Without additional information, Lisa Ruiz Ruiz's living daughter and her current pregnancy are Lisa Ruiz a 25% chance of being carriers for the familial Lisa Ruiz condition (carrier daughters and affected sons).     We reviewed with Lisa Ruiz Ruiz the importance of having a clinical and/or genetic diagnosis for the most appropriate risk assessment/testing. Lisa Ruiz Ruiz stated that she plans to have additional discussion with her mother about the name of the Lisa Ruiz condition in the family.     Lisa Ruiz Ruiz, particularly females who may be asymptomatic, such as Lisa Ruiz Ruiz, Lisa Ruiz Ruiz, and Lisa Ruiz Ruiz, can consider their own genetics evaluations.     General Prenatal Testing Options   Non-invasive prenatal Screening (NIPS):   We discussed with Lisa Ruiz Ruiz that NIPS analyzes cell-free DNA circulating in the mother's blood and screens for trisomy 36, 43, 37, sex chromosome anomalies, and 22q11.2 microdeletion syndrome. Although NIPS has a high detection rate (>99% for trisomy 23 and trisomy 73, >98% for trisomy 33), it remains a screening test and does not replace a diagnostic test. Specifically, NIPS does not exclude other numerical chromosome abnormalities or cytogenetic abnormalities that may be diagnosed by chromosome analysis via chorionic villus  sampling (CVS) or amniocentesis.     Decisions: after a discussion outlining the benefits and limitations of NIPS, Lisa Ruiz Ruiz elected to pursue NIPS.     Diagnostic Testing Options:     Amniocentesis: We discussed that amniocentesis is a diagnostic test as it analyzes fetal cells in amniotic fluid and that is accompanied by a 1/900 miscarriage rate.       We discussed with Lisa Ruiz Ruiz that the testing to be conducted on the fetal sample is a chromosomal microarray (CMA). CMA assesses all 23 pairs of chromosomes for extra or missing chromosomal material (e.g., aneuploidies, unbalanced chromosomal rearrangements, and copy number variants including deletions and duplications).    Decisions: After a discussion regarding the benefits and drawbacks of amniocentesis, Lisa Ruiz Ruiz declined the option of amniocentesis.     [place] prenatal screening program (CPSP)  We discussed with Starlyn that a quad screen utilizes a second trimester blood draw to provide a risk estimate (and not a diagnostic result) for trisomy 77, trisomy 64, Smith-Lemli-Opitz Syndrome, and Open neural tube defects. The risk estimates are calculated using a combination of variables which may include biochemical serum markers and maternal age, ethnicity, weight, diabetic status, and smoking status.   Decisions: Zandria expressed interest in pursuing a second trimester quad screen for SLOS and ONTD risk assessment alone. We would truly appreciate your help in facilitating the appropriate next steps.     Carrier Screening:   Rationale we provided Sudie the option  of carrier screening during her current pregnancy. We discussed that the rationale behind carrier screening is to ultimately determine whether a fetus may be Lisa Ruiz risk for certain autosomal recessive (AR) or Lisa Ruiz conditions. Possible relevant Lisa Ruiz conditions on the panel include severe combined immunodeficiency, Lisa Ruiz adrenoleukodystrophy, cerebral creatine deficiency syndrome, myotubular myopathy, and  pyruvate dehydrogenase deficiency.   Test the [facility] expanded carrier screen by Johnsie Cancel examines 274 genes associated with AR conditions which range in their degree of severity, morbidity, and treatment options. This carrier screen has a yield of ~64% and is clinically significant to the pregnancy depending on the mode of inheritance and/or whether both parents are carriers of the same genetic mutation.   Results we discussed with Zanyla that in the case she is a carrier for an autosomal recessive condition, Sherron Monday would be provided with the option to pursue screening. In the case that they are both carriers, there is 25% chance the fetus will be affected.  If Ameliarose is a carrier of an Lisa Ruiz condition, the risks and phenotypic features will vary depending on the sex of the fetus.   A negative test result greatly reduces the chance of being a carrier for the conditions screened for, but even with a negative result, there is still a small chance of being a carrier.   Future Steps For Lisa Ruiz risk-pregnancies, the couple would have the option of pursuing prenatal diagnostic testing via CVS or amniocentesis. For future pregnancies, the couple has the option to pursue preimplantation genetic diagnosis (PGD) with IVF, ovum or sperm donation, or adoption.      Decisions: After a discussion regarding the benefits and drawbacks of carrier screening, Forrest elected to pursue carrier screening.     Limitations to screening and testing   We reviewed with Kadyn that every pregnancy has a 2-3% background risk of a congenital abnormalities Therefore, although low-risk screening and/or diagnostic testing results are reassuring, they cannot guarantee a baby free of congenital abnormalities and cannot always assess for intellectual capacity.     Follow up and plan   NIPS and Carrier screening: We will schedule Chelby for kit pick-up and blood draw instructions. We will follow-up with results accordingly.    Family History: We have  provided Preslyn with our clinic phone number and have encouraged her to contact us as she has additional discussion with her mother about the familial diagnosis. We will facilitate appropriate testing and follow-up, if Ronie desires.   [place] prenatal screening program: Haden expressed interest in pursuing a second trimester quad screen for SLOS and ONTD risk assessment only. We would truly appreciate your help in facilitating the appropriate next steps.       Thank you very much for your referral.  Please contact us Lisa Ruiz [phone] if you have questions.    Sincerely,   Cain Sieve, MSc, [facility]  Licensed and Certified Dietitian     I was not present for the patient's prenatal genetics clinic visit, but I did personally review the patient's family history, prenatal screening results, and medical records and I agree with the genetic counselor's assessment and plan as documented in the genetic counselor's note.     Michell Heinrich, MD  Attending Physician   Dept. of Obstetrics and Gynecology  [phone]

## 2020-08-07 NOTE — Progress Notes (Signed)
After identified patient X3 : verified name, DOB, and MRN .  Faxed report to referring provider .        Fax confirmation received.     Murrell Dome E. Edward Trevino, MA II  Prenatal Diagnosis [place]   #:[phone]

## 2020-08-14 ENCOUNTER — Telehealth: Payer: Self-pay

## 2020-08-14 NOTE — Telephone Encounter (Signed)
Telephone call to patient-- verified name, DOB, MRN at initiation of call.  Made her aware I was following up regarding e-mail sent to her and I on 07/30/20 from [facility] in regards to  NIPS & carrier screening as patient has no transportation therefore we will attempt to arrange consenting and kit delivery, and mobile phlebotomy services .   Patient stated she has not checked her e-mails , she will read and review. She has follow up ob visit later this month, if e-mailing and printing is too much of a hassle then she will just request for her [name] to provide NIPS & carrier screenings thru their office . If patient has any questions , need help with e-mail or decides to decline she will call us [phone].    Before handing up I did confirm e-mail is same . :   [email or web address]    Lovina Reach, MA II  Prenatal Diagnosis [place]   [phone]

## 2020-09-01 NOTE — Telephone Encounter (Signed)
E-mail response  to patient--verified name, DOB, MRN . I replied to e-mail :     Lisa Ruiz, I received   e-mail from Theotis Burrow, MSc, [facility] in which you provide consent to communicate over e-mail.    I have attached the following which I need you to print, sign and return  via email to my attention:  [email or web address].     Consent form for NIPS-sign yes and date   Declination form for Amniocentesis- sign No and date   NIPS/carrier screening requisition form-    verify all information then sign and date     Please print ,sign ,complete and return via email to my attention:  [email or web address].    If you have any questions or concerns please do not hesitate to call me [phone].         Cy Blamer, MA II  Prenatal Diagnosis [place]   [phone]

## 2020-09-03 ENCOUNTER — Ambulatory Visit (INDEPENDENT_AMBULATORY_CARE_PROVIDER_SITE_OTHER): Payer: Medicaid Other | Admitting: Women's Health

## 2020-09-03 ENCOUNTER — Other Ambulatory Visit: Payer: Self-pay

## 2020-09-03 ENCOUNTER — Other Ambulatory Visit (HOSPITAL_COMMUNITY)
Admission: RE | Admit: 2020-09-03 | Discharge: 2020-09-03 | Disposition: A | Discharge status: Home / Self Care | Payer: Medicaid Other | Source: Ambulatory Visit | Attending: Obstetrics & Gynecology | Admitting: Obstetrics & Gynecology

## 2020-09-03 ENCOUNTER — Encounter: Payer: Self-pay | Admitting: Women's Health

## 2020-09-03 VITALS — BP 126/73 | HR 90 | Ht 61.5 in | Wt 150.4 lb

## 2020-09-03 DIAGNOSIS — Z01419 Encounter for gynecological examination (general) (routine) without abnormal findings: Secondary | ICD-10-CM

## 2020-09-03 DIAGNOSIS — Z975 Presence of (intrauterine) contraceptive device: Secondary | ICD-10-CM | POA: Diagnosis not present

## 2020-09-03 DIAGNOSIS — Z30430 Encounter for insertion of intrauterine contraceptive device: Secondary | ICD-10-CM | POA: Diagnosis not present

## 2020-09-03 LAB — POCT URINE PREGNANCY: Preg Test, Ur: NEGATIVE

## 2020-09-03 MED ORDER — LEVONORGESTREL 19.5 MG IU IUD
1.0000 | INTRAUTERINE_SYSTEM | Freq: Once | INTRAUTERINE | Status: AC
Start: 2020-09-03 — End: 2020-09-03
  Administered 2020-09-03: 1 via INTRAUTERINE

## 2020-09-03 NOTE — Patient Instructions (Signed)
 Nothing in vagina for 3 days (no sex, douching, tampons, etc...)  Check your strings once a month to make sure you can feel them, if you are not able to please let us know  If you develop a fever of 100.4 or more in the next few weeks, or if you develop severe abdominal pain, please let us know  Use a backup method of birth control, such as condoms, for 2 weeks     Intrauterine Device Insertion, Care After This sheet gives you information about how to care for yourself after your procedure. Your health care provider may also give you more specific instructions. If you have problems or questions, contact your health care provider. What can I expect after the procedure? After the procedure, it is common to have:  Cramps and pain in the abdomen.  Bleeding. It may be light or heavy. This may last for a few days.  Lower back pain.  Dizziness.  Headaches.  Nausea. Follow these instructions at home:  Before resuming sexual activity, check to make sure that you can feel the IUD string or strings. You should be able to feel the end of the string below the opening of your cervix. If your IUD string is in place, you may resume sexual activity. ? If you had a hormonal IUD inserted more than 7 days after your most recent period started, you will need to use a backup method of birth control for 7 days after IUD insertion. Ask your health care provider whether this applies to you.  Continue to check that the IUD is still in place by feeling for the strings after every menstrual period, or once a month.  An IUD will not protect you from sexually transmitted infections (STIs). Use methods to prevent the exchange of body fluids between partners (barrier protection) every time you have sex. Barrier protection can be used during oral, vaginal, or anal sex. Commonly used barrier methods include: ? Female condom. ? Female condom. ? Dental dam.  Take over-the-counter and prescription medicines only as  told by your health care provider.  Keep all follow-up visits as told by your health care provider. This is important.   Contact a health care provider if:  You feel light-headed or weak.  You have any of the following problems with your IUD string or strings: ? The string bothers or hurts you or your sexual partner. ? You cannot feel the string. ? The string has gotten longer.  You can feel the IUD in your vagina.  You think you may be pregnant, or you miss your menstrual period.  You think you may have a sexually transmitted infection (STI). Get help right away if:  You have flu-like symptoms, such as tiredness (fatigue) and muscle aches.  You have a fever and chills.  You have bleeding that is heavier or lasts longer than a normal menstrual cycle.  You have abnormal or bad-smelling discharge from your vagina.  You develop abdominal pain that is new, is getting worse, or is not in the same area of earlier cramping and pain.  You have pain during sexual activity. Summary  After the procedure, it is common to have cramps and pain in the abdomen. It is also common to have light bleeding or heavier bleeding that is like your menstrual period.  Continue to check that the IUD is still in place by feeling for the strings after every menstrual period, or once a month.  Keep all follow-up visits as   told by your health care provider. This is important.  Contact your health care provider if you have problems with your IUD strings, such as the string getting longer or bothering you or your sexual partner. This information is not intended to replace advice given to you by your health care provider. Make sure you discuss any questions you have with your health care provider. Document Revised: 05/14/2019 Document Reviewed: 05/14/2019 Elsevier Patient Education  2021 Elsevier Inc.  

## 2020-09-03 NOTE — Progress Notes (Signed)
   IUD INSERTION Patient name: Gabriella Wilson MRN [id number]  Date of birth: Jul 07, 1995 Subjective Findings:   Gabriella Wilson is a 25 y.o. G41P1001 Caucasian female being seen today for insertion of a Kyleena IUD. Had Mirena and did not like it, wants less hormones. Did not do well w/ pills.  Depression screen PHQ 2/9 05/06/2019  Decreased Interest 0  Down, Depressed, Hopeless 1  PHQ - 2 Score 1    Patient's last menstrual period was 08/23/2020 (exact date). Last sexual intercourse was >3wks ago Last pap3/2/21. Results were: LSIL w/ HRHPV not done  The risks and benefits of the method and placement have been thouroughly reviewed with the patient and all questions were answered.  Specifically the patient is aware of failure rate of 06/998, expulsion of the IUD and of possible perforation.  The patient is aware of irregular bleeding due to the method and understands the incidence of irregular bleeding diminishes with time.  Signed copy of informed consent in chart.  Pertinent History Reviewed:   Reviewed past medical,surgical, social, obstetrical and family history.  Reviewed problem list, medications and allergies. Objective Findings & Procedure:   Vitals:   09/03/20 0943  BP: 126/73  Pulse: 90  Weight: 150 lb 6.4 oz (68.2 kg)  Height: 5' 1.5" (1.562 m)  Body mass index is 27.96 kg/m.  Results for orders placed or performed in visit on 09/03/20 (from the past 24 hour(s))  POCT urine pregnancy   Collection Time: 09/03/20 10:10 AM  Result Value Ref Range   Preg Test, Ur Negative Negative     Thin prep pap obtained by Albertine Grates, SNP  IUD insertion by me Time out was performed.  A graves speculum was placed in the vagina.  The cervix was visualized, prepped using Betadine, and grasped with a single tooth tenaculum. The uterus was found to be neutral and it sounded to 7 cm.  Kyleena  IUD placed per manufacturer's recommendations. The strings were trimmed to approximately 3 cm. The  patient tolerated the procedure well.   Informal transvaginal sonogram was performed and the proper placement of the IUD was verified.  Chaperone: Albertine Grates, SNP   Assessment & Plan:   1) Kyleena IUD insertion The patient was given post procedure instructions, including signs and symptoms of infection and to check for the strings after each menses or each month, and refraining from intercourse or anything in the vagina for 3 days. She was given a care card with date IUD placed, and date IUD to be removed. She is scheduled for a f/u appointment in 4 weeks.  2) H/O abnormal pap> repeated today  Orders Placed This Encounter  Procedures  . POCT urine pregnancy    Return in about 4 weeks (around 10/01/2020) for IUD f/u, CNM, in person.  Cheral Marker CNM, [facility] 09/03/2020 10:40 AM

## 2020-09-07 LAB — CYTOLOGY - PAP
Chlamydia: POSITIVE — AB
Comment: NEGATIVE
Comment: NEGATIVE
Comment: NORMAL
Diagnosis: UNDETERMINED — AB
High risk HPV: NEGATIVE
Neisseria Gonorrhea: NEGATIVE

## 2020-09-07 MED ORDER — AZITHROMYCIN 500 MG PO TABS: ORAL_TABLET | ORAL | 0 refills | Status: DC

## 2020-09-07 NOTE — Addendum Note (Signed)
Addended by: Shawna Clamp R on: 09/07/2020 12:01 PM   Modules accepted: Orders

## 2020-09-21 ENCOUNTER — Telehealth: Payer: Self-pay

## 2020-09-21 NOTE — Telephone Encounter (Signed)
I sent a secure e-mail to patient 09/01/20 with consent forms with clear instructions.     As of today 09/21/20 no response e-mail has been received. EDD: 01/27/21, GA: [redacted]w[redacted]d.       Telephone call to patient-- verified name, DOB, MRN at initiation of call.  I left a message voice mail requesting for the patient to return my call re: NIPS status, if had drawn, plans to have drawn or decided to decline, I just need direction on how to follow up. Please return my call to [phone].       If you have any questions or concerns please do not hesitate to call me [phone].         Lashara Urey, MA II  Prenatal Diagnosis [place]   #:[phone]

## 2020-09-21 NOTE — Telephone Encounter (Signed)
I sent a secure e-mail to patient 09/01/20 with consent forms with clear instructions.     As of today 09/21/20 no response e-mail has been received. EDD: 01/27/21, GA: [redacted]w[redacted]d.       Telephone call to patient-- verified name, DOB, MRN at initiation of call.  I left a message voice mail requesting for the patient to return my call re: NIPS status, if had drawn, plans to have drawn or decided to decline, I just need direction on how to follow up. Please return my call to [phone].       If you have any questions or concerns please do not hesitate to call me [phone].         Cy Blamer, MA II  Prenatal Diagnosis [place]   [phone]

## 2020-09-28 ENCOUNTER — Telehealth: Payer: Self-pay

## 2020-09-28 NOTE — Telephone Encounter (Signed)
Telephone call from  patient--verified name, DOB, MRN and/or address at initiation of call.    Patient returned my call, she stated she is still interested in having the genetic screenings. She will double check her e-mail she might just have the wrong e-mail or was too large of a file.     I made patient aware to please verify and e-mail ASAP as I will be out of office . Patient   has no barriers to learning and verbalizes understanding of information given and plan.      Cy Blamer, MA II  Prenatal Diagnosis [place]   [phone]

## 2020-09-28 NOTE — Telephone Encounter (Signed)
I sent a secure e-mail to patient 09/01/20 with consent forms with clear instructions.     As of today 09/28/20 no response e-mail has been received. EDD: 01/27/21, GA: [redacted]w[redacted]d.       Telephone call to patient-- verified name, DOB, MRN at initiation of call. Left message this is our final attempt trying to reach her, if we do not hear from her by end of day we will inform her referring provider and close message.        If  any questions or need clarification please do not hesitate to call me [phone].         Lisa Stampley, MA II  Prenatal Diagnosis [place]   #:[phone]

## 2020-09-28 NOTE — Telephone Encounter (Signed)
I sent a secure e-mail to patient 09/01/20 with consent forms with clear instructions.     As of today 09/28/20 no response e-mail has been received. EDD: 01/27/21, GA: [redacted]w[redacted]d.       Telephone call to patient-- verified name, DOB, MRN at initiation of call. Left message this is our final attempt trying to reach her, if we do not hear from her by end of day we will inform her referring provider and close message.        If  any questions or need clarification please do not hesitate to call me [phone].         Cy Blamer, MA II  Prenatal Diagnosis [place]   [phone]

## 2020-10-02 ENCOUNTER — Ambulatory Visit: Payer: Medicaid Other | Admitting: Women's Health

## 2020-10-02 ENCOUNTER — Telehealth: Payer: Self-pay

## 2020-10-02 NOTE — Telephone Encounter (Signed)
Late entry for 09/29/20:    Telephone call from  patient--verified name, DOB, MRN and/or address at initiation of call.    Patient returned my call, she stated she is still interested in having the genetic screenings. She will double check her e-mail she might just have the wrong e-mail or was too large of a file.     I made patient aware to please verify and e-mail ASAP as I will be out of office . Patient   has no barriers to learning and verbalizes understanding of information given and plan.      Cy Blamer, MA II  Prenatal Diagnosis [place]   [phone]

## 2020-10-28 ENCOUNTER — Telehealth: Payer: Self-pay

## 2020-10-28 NOTE — Telephone Encounter (Signed)
I sent a secure e-mail to patient 09/01/20 with consent forms with clear instructions.     international unit(s) spoke with patient on 10/02/20 at which time she stated would e-mail consents back and move forward with screening. As of today 10/28/20 no response e-mail has been received. EDD: 01/27/21, GA: [redacted]w[redacted]d.     Telephone call to patient-- verified name, DOB, MRN at initiation of call. Left message this is our final attempt trying to reach her, if we do not hear from her by end of day we will inform her referring provider and close message.        If  any questions or need clarification please do not hesitate to call me [phone].        Cy Blamer, MA II  Prenatal Diagnosis [place]   [phone]

## 2020-11-06 NOTE — Telephone Encounter (Signed)
Closing message ,No response from patient after multiple attempts to phone numbers on file.      Ulas Zuercher, MA II  Prenatal Diagnosis [place]   [phone]

## 2021-01-21 ENCOUNTER — Encounter: Payer: Medicaid Other | Admitting: Adult Health

## 2021-01-25 ENCOUNTER — Encounter: Payer: Medicaid Other | Admitting: Adult Health

## 2021-03-03 ENCOUNTER — Other Ambulatory Visit: Payer: Self-pay | Admitting: Women's Health

## 2021-03-03 MED ORDER — DOXYCYCLINE HYCLATE 100 MG PO CAPS: 100.0000 mg | ORAL_CAPSULE | Freq: Two times a day (BID) | ORAL | 0 refills | Status: DC

## 2021-03-03 MED ORDER — FLUCONAZOLE 150 MG PO TABS: 150.0000 mg | ORAL_TABLET | Freq: Every day | ORAL | 1 refills | Status: DC

## 2021-03-03 NOTE — Progress Notes (Signed)
Read Mychart msg from 03/03/21. Exposed to chlamydia. Has sx. Rx sent.  Cheral Marker, CNM, [facility] 03/03/2021 1:47 PM

## 2021-03-22 ENCOUNTER — Ambulatory Visit: Payer: Medicaid Other | Admitting: Adult Health

## 2021-04-05 ENCOUNTER — Other Ambulatory Visit: Payer: Self-pay | Admitting: Women's Health

## 2021-06-15 ENCOUNTER — Encounter: Payer: Self-pay | Admitting: Women's Health

## 2021-07-16 ENCOUNTER — Other Ambulatory Visit: Payer: Self-pay | Admitting: Women's Health

## 2021-11-15 ENCOUNTER — Encounter: Payer: Self-pay | Admitting: Women's Health

## 2021-11-15 ENCOUNTER — Other Ambulatory Visit (HOSPITAL_COMMUNITY)
Admission: RE | Admit: 2021-11-15 | Discharge: 2021-11-15 | Disposition: A | Discharge status: Home / Self Care | Payer: Medicaid Other | Source: Ambulatory Visit | Attending: Women's Health | Admitting: Women's Health

## 2021-11-15 ENCOUNTER — Ambulatory Visit (INDEPENDENT_AMBULATORY_CARE_PROVIDER_SITE_OTHER): Payer: Medicaid Other | Admitting: Women's Health

## 2021-11-15 VITALS — BP 119/83 | HR 91 | Ht 61.0 in | Wt 160.0 lb

## 2021-11-15 DIAGNOSIS — Z124 Encounter for screening for malignant neoplasm of cervix: Secondary | ICD-10-CM

## 2021-11-15 DIAGNOSIS — Z113 Encounter for screening for infections with a predominantly sexual mode of transmission: Secondary | ICD-10-CM | POA: Diagnosis not present

## 2021-11-15 DIAGNOSIS — Z30432 Encounter for removal of intrauterine contraceptive device: Secondary | ICD-10-CM

## 2021-11-15 DIAGNOSIS — Z30011 Encounter for initial prescription of contraceptive pills: Secondary | ICD-10-CM

## 2021-11-15 DIAGNOSIS — G43109 Migraine with aura, not intractable, without status migrainosus: Secondary | ICD-10-CM | POA: Insufficient documentation

## 2021-11-15 MED ORDER — SLYND 4 MG PO TABS: 1.0000 | ORAL_TABLET | Freq: Every day | ORAL | 3 refills | Status: DC

## 2021-11-15 NOTE — Addendum Note (Signed)
Addended by: Moss Mc on: 11/15/2021 04:22 PM   Modules accepted: Orders

## 2021-11-15 NOTE — Progress Notes (Signed)
   IUD REMOVAL  Patient name: Gabriella Wilson MRN [id number]  Date of birth: August 31, 1995 Subjective Findings:   Gabriella Wilson is a 26 y.o. G90P1001 female being seen today for removal of a Kyleena  IUD. Her IUD was placed 09/03/20.  She desires removal because of weight gain, acne, headaches, increased sweating. Signed copy of informed consent in chart.  No LMP recorded. (Menstrual status: IUD). Last pap3/31/22. Results were: ASCUS w/ HRHPV negative, past due for f/u The planned method of family planning is discussed all options, wants pills.Does have migraines w/ aura. Discussed POPs Requests STD screen     05/06/2019    2:49 PM  Depression screen PHQ 2/9  Decreased Interest 0  Down, Depressed, Hopeless 1  PHQ - 2 Score 1         No data to display           Pertinent History Reviewed:   Reviewed past medical,surgical, social, obstetrical and family history.  Reviewed problem list, medications and allergies. Objective Findings & Procedure:    Vitals:   11/15/21 1519  BP: 119/83  Pulse: 91  Weight: 160 lb (72.6 kg)  Height: 5\' 1"  (1.549 m)  Body mass index is 30.23 kg/m.  No results found for this or any previous visit (from the past 24 hour(s)).   Time out was performed.  A graves speculum was placed in the vagina.  The cervix was visualized, pap smear obtained and the strings were visible. They were grasped and the [facility]  IUD was easily removed intact without complications. The patient tolerated the procedure well.   [facility]aper[facility]: [facility]   Assessment & Plan:   1) Kyleena  IUD removal Follow-up prn problems  2) H/O abnormal pap> past due for pap, done today  3) STD screen> gc/ct on pap  4) Contraception management> rx Slynd to [facility], 2 samples given, condoms x 2wks  No orders of the defined types were placed in this encounter.   Follow-up: Return in about 1 year (around 11/16/2022) for Physical.  01/16/2023 CNM, [facility] 11/15/2021 4:02 PM

## 2021-11-18 LAB — CYTOLOGY - PAP
Chlamydia: NEGATIVE
Comment: NEGATIVE
Comment: NEGATIVE
Comment: NORMAL
Diagnosis: UNDETERMINED — AB
High risk HPV: POSITIVE — AB
Neisseria Gonorrhea: NEGATIVE

## 2021-11-22 MED ORDER — METRONIDAZOLE 500 MG PO TABS: 500.0000 mg | ORAL_TABLET | Freq: Two times a day (BID) | ORAL | 0 refills | Status: AC

## 2021-11-22 NOTE — Addendum Note (Signed)
Addended by: Cheral Marker on: 11/22/2021 09:58 AM   Modules accepted: Orders

## 2021-11-23 ENCOUNTER — Encounter: Payer: Self-pay | Admitting: Women's Health

## 2021-11-23 ENCOUNTER — Other Ambulatory Visit: Payer: Self-pay | Admitting: Women's Health

## 2021-11-23 MED ORDER — FLUCONAZOLE 150 MG PO TABS: 150.0000 mg | ORAL_TABLET | Freq: Once | ORAL | 0 refills | Status: AC

## 2021-12-27 ENCOUNTER — Other Ambulatory Visit: Payer: Self-pay | Admitting: Women's Health

## 2021-12-27 ENCOUNTER — Encounter: Payer: Self-pay | Admitting: Women's Health

## 2021-12-27 MED ORDER — NORETHINDRONE 0.35 MG PO TABS: 1.0000 | ORAL_TABLET | Freq: Every day | ORAL | 11 refills | Status: DC

## 2022-04-27 ENCOUNTER — Encounter: Payer: Self-pay | Admitting: Women's Health

## 2022-05-02 ENCOUNTER — Other Ambulatory Visit: Payer: Self-pay | Admitting: Women's Health

## 2022-05-02 MED ORDER — SLYND 4 MG PO TABS: 1.0000 | ORAL_TABLET | Freq: Every day | ORAL | 3 refills | Status: DC

## 2022-05-03 ENCOUNTER — Other Ambulatory Visit: Payer: Self-pay | Admitting: Women's Health

## 2022-05-03 MED ORDER — SLYND 4 MG PO TABS: 1.0000 | ORAL_TABLET | Freq: Every day | ORAL | 3 refills | Status: AC

## 2022-05-13 ENCOUNTER — Other Ambulatory Visit: Payer: Self-pay | Admitting: Adult Health

## 2022-05-13 ENCOUNTER — Encounter: Payer: Self-pay | Admitting: *Deleted

## 2022-05-13 MED ORDER — FLUCONAZOLE 150 MG PO TABS: ORAL_TABLET | ORAL | 1 refills | Status: DC

## 2022-05-13 NOTE — Progress Notes (Signed)
Rx sent in for diflucan.   

## 2022-07-12 ENCOUNTER — Other Ambulatory Visit: Payer: Self-pay | Admitting: Adult Health

## 2022-07-12 ENCOUNTER — Encounter: Payer: Self-pay | Admitting: Women's Health

## 2022-07-12 MED ORDER — FLUCONAZOLE 150 MG PO TABS: ORAL_TABLET | ORAL | 1 refills | Status: DC

## 2022-07-12 NOTE — Progress Notes (Signed)
Refill diflucan 

## 2022-08-04 ENCOUNTER — Encounter: Payer: Self-pay | Admitting: Radiology

## 2022-11-11 ENCOUNTER — Telehealth: Payer: Self-pay | Admitting: *Deleted

## 2022-11-11 ENCOUNTER — Encounter: Payer: Self-pay | Admitting: Women's Health

## 2022-11-11 MED ORDER — FLUCONAZOLE 150 MG PO TABS: 150.0000 mg | ORAL_TABLET | Freq: Once | ORAL | 1 refills | Status: AC

## 2022-11-11 NOTE — Telephone Encounter (Signed)
2 boxes of Slynd samples given. Lot # [id number] EXP 04/2025. Pt to pick up at front desk. JSY

## 2022-11-11 NOTE — Telephone Encounter (Signed)
Pt is requesting something for a yeast infection. Thanks! JSY

## 2022-11-28 ENCOUNTER — Ambulatory Visit: Payer: Medicaid Other | Admitting: Women's Health

## 2023-01-29 ENCOUNTER — Encounter: Payer: Self-pay | Admitting: Women's Health

## 2023-01-30 ENCOUNTER — Other Ambulatory Visit: Payer: Self-pay | Admitting: Women's Health

## 2023-01-30 MED ORDER — FLUCONAZOLE 150 MG PO TABS: 150.0000 mg | ORAL_TABLET | Freq: Once | ORAL | 0 refills | Status: AC

## 2023-04-05 ENCOUNTER — Encounter: Payer: Self-pay | Admitting: Women's Health

## 2023-12-14 ENCOUNTER — Encounter
